# Patient Record
Sex: Male | Born: 1945
Health system: Southern US, Community
[De-identification: ages and names within clinical notes are randomized; demographics above are authoritative.]

## PROBLEM LIST (undated history)

## (undated) DIAGNOSIS — K219 Gastro-esophageal reflux disease without esophagitis: Secondary | ICD-10-CM

## (undated) DIAGNOSIS — E785 Hyperlipidemia, unspecified: Secondary | ICD-10-CM

## (undated) DIAGNOSIS — Z972 Presence of dental prosthetic device (complete) (partial): Secondary | ICD-10-CM

## (undated) HISTORY — PX: SHOULDER SURGERY: SHX246

## (undated) HISTORY — PX: PILONIDAL CYST EXCISION: SHX744

## (undated) HISTORY — DX: Gastro-esophageal reflux disease without esophagitis: K21.9

## (undated) HISTORY — DX: Hyperlipidemia, unspecified: E78.5

---

## 1967-09-15 HISTORY — PX: GANGLION CYST EXCISION: SHX1691

## 1986-09-14 HISTORY — PX: HEMORROIDECTOMY: SUR656

## 2004-09-14 HISTORY — PX: COLONOSCOPY: SHX174

## 2004-12-05 ENCOUNTER — Ambulatory Visit: Payer: Self-pay | Admitting: Gastroenterology

## 2006-12-09 ENCOUNTER — Ambulatory Visit: Payer: Self-pay | Admitting: Family Medicine

## 2007-08-15 ENCOUNTER — Ambulatory Visit: Payer: Self-pay | Admitting: Pain Medicine

## 2007-08-24 ENCOUNTER — Ambulatory Visit: Payer: Self-pay | Admitting: Pain Medicine

## 2007-09-29 ENCOUNTER — Ambulatory Visit: Payer: Self-pay | Admitting: Pain Medicine

## 2007-10-12 ENCOUNTER — Ambulatory Visit: Payer: Self-pay | Admitting: Pain Medicine

## 2007-11-22 ENCOUNTER — Ambulatory Visit: Payer: Self-pay | Admitting: Pain Medicine

## 2007-11-30 ENCOUNTER — Ambulatory Visit: Payer: Self-pay | Admitting: Pain Medicine

## 2008-01-17 ENCOUNTER — Ambulatory Visit: Payer: Self-pay | Admitting: Pain Medicine

## 2008-01-23 ENCOUNTER — Ambulatory Visit: Payer: Self-pay | Admitting: Pain Medicine

## 2011-08-30 ENCOUNTER — Ambulatory Visit: Payer: Self-pay | Admitting: Internal Medicine

## 2015-08-13 ENCOUNTER — Encounter: Payer: Self-pay | Admitting: Family Medicine

## 2015-08-13 ENCOUNTER — Ambulatory Visit (INDEPENDENT_AMBULATORY_CARE_PROVIDER_SITE_OTHER): Payer: PPO | Admitting: Family Medicine

## 2015-08-13 VITALS — BP 120/72 | HR 64 | Ht 71.0 in | Wt 194.2 lb

## 2015-08-13 DIAGNOSIS — J0100 Acute maxillary sinusitis, unspecified: Secondary | ICD-10-CM

## 2015-08-13 DIAGNOSIS — E785 Hyperlipidemia, unspecified: Secondary | ICD-10-CM | POA: Diagnosis not present

## 2015-08-13 DIAGNOSIS — K219 Gastro-esophageal reflux disease without esophagitis: Secondary | ICD-10-CM | POA: Diagnosis not present

## 2015-08-13 MED ORDER — AMOXICILLIN 500 MG PO CAPS: 500.0000 mg | ORAL_CAPSULE | Freq: Three times a day (TID) | ORAL | Status: DC

## 2015-08-13 MED ORDER — PANTOPRAZOLE SODIUM 40 MG PO TBEC: 40.0000 mg | DELAYED_RELEASE_TABLET | Freq: Every day | ORAL | Status: DC

## 2015-08-13 MED ORDER — SIMVASTATIN 20 MG PO TABS: 20.0000 mg | ORAL_TABLET | Freq: Every day | ORAL | Status: DC

## 2015-08-13 NOTE — Progress Notes (Signed)
Name: Miguel Adams   MRN: [id number]    DOB: 26-Jun-1946   Date:08/13/2015       Progress Note  Subjective  Chief Complaint  Chief Complaint  Patient presents with  . Hyperlipidemia  . Gastroesophageal Reflux    Hyperlipidemia This is a chronic problem. The current episode started more than 1 year ago. The problem is controlled. Recent lipid tests were reviewed and are normal. He has no history of diabetes, hypothyroidism, liver disease, obesity or nephrotic syndrome. There are no known factors aggravating his hyperlipidemia. Pertinent negatives include no focal sensory loss, focal weakness, leg pain, myalgias or shortness of breath. He is currently on no antihyperlipidemic treatment. The current treatment provides no improvement of lipids. There are no compliance problems.  Risk factors for coronary artery disease include dyslipidemia.  Gastroesophageal Reflux He complains of coughing. He reports no abdominal pain, no dysphagia, no heartburn, no nausea, no sore throat or no wheezing. This is a chronic problem. The current episode started more than 1 year ago. The problem occurs occasionally. The problem has been gradually improving. The symptoms are aggravated by certain foods. Pertinent negatives include no anemia, fatigue, melena, muscle weakness, orthopnea or weight loss. There are no known risk factors. He has tried nothing for the symptoms. The treatment provided no relief. Past procedures do not include an abdominal ultrasound, an EGD, esophageal manometry, esophageal pH monitoring, H. pylori antibody titer or a UGI.  Cough This is a new problem. The current episode started in the past 7 days. The problem has been waxing and waning. The problem occurs every few hours. The cough is non-productive. Associated symptoms include rhinorrhea. Pertinent negatives include no chills, ear congestion, ear pain, fever, headaches, heartburn, myalgias, rash, sore throat, shortness of breath, weight loss  or wheezing. Nothing aggravates the symptoms. He has tried OTC cough suppressant for the symptoms. The treatment provided no relief. There is no history of asthma, bronchiectasis, bronchitis, COPD, emphysema, environmental allergies or pneumonia.    No problem-specific assessment & plan notes found for this encounter.   No past medical history on file.  No past surgical history on file.  No family history on file.  Social History   Social History  . Marital Status: Married    Spouse Name: N/A  . Number of Children: N/A  . Years of Education: N/A   Occupational History  . Not on file.   Social History Main Topics  . Smoking status: Never Smoker   . Smokeless tobacco: Not on file  . Alcohol Use: 0.0 oz/week    0 Standard drinks or equivalent per week  . Drug Use: No  . Sexual Activity: Not on file   Other Topics Concern  . Not on file   Social History Narrative  . No narrative on file    No Known Allergies   Review of Systems  Constitutional: Negative for fever, chills, weight loss, diaphoresis and fatigue.  HENT: Positive for rhinorrhea. Negative for congestion, ear discharge, ear pain, hearing loss, nosebleeds, sore throat and tinnitus.   Respiratory: Positive for cough. Negative for sputum production, shortness of breath, wheezing and stridor.   Cardiovascular: Negative for leg swelling.  Gastrointestinal: Negative for heartburn, dysphagia, nausea, abdominal pain, diarrhea, constipation, blood in stool and melena.  Genitourinary: Negative for dysuria, urgency, frequency and hematuria.  Musculoskeletal: Negative for myalgias, back pain, joint pain and muscle weakness.  Skin: Negative for itching and rash.  Neurological: Negative for dizziness, tingling, sensory change, focal weakness,  weakness and headaches.  Endo/Heme/Allergies: Negative for environmental allergies and polydipsia. Does not bruise/bleed easily.  Psychiatric/Behavioral: Negative for depression and  suicidal ideas. The patient is not nervous/anxious and does not have insomnia.      Objective  Filed Vitals:   08/13/15 0827  BP: 120/72  Pulse: 64  Height: 5\' 11"  (1.803 m)  Weight: 194 lb 3.2 oz (88.089 kg)    Physical Exam  Constitutional: He is oriented to person, place, and time and well-developed, well-nourished, and in no distress.  HENT:  Head: Normocephalic.  Right Ear: External ear normal.  Left Ear: External ear normal.  Nose: Nose normal.  Mouth/Throat: Oropharynx is clear and moist.  Eyes: Conjunctivae and EOM are normal. Pupils are equal, round, and reactive to light. Right eye exhibits no discharge. Left eye exhibits no discharge. No scleral icterus.  Neck: Normal range of motion. Neck supple. No JVD present. No tracheal deviation present. No thyromegaly present.  Cardiovascular: Normal rate, regular rhythm, normal heart sounds and intact distal pulses.  Exam reveals no gallop and no friction rub.   No murmur heard. Pulmonary/Chest: Breath sounds normal. No respiratory distress. He has no wheezes. He has no rales.  Abdominal: Soft. Bowel sounds are normal. He exhibits no mass. There is no hepatosplenomegaly. There is no tenderness. There is no rebound, no guarding and no CVA tenderness.  Musculoskeletal: Normal range of motion. He exhibits no edema or tenderness.  Lymphadenopathy:    He has no cervical adenopathy.  Neurological: He is alert and oriented to person, place, and time. He has normal sensation, normal strength, normal reflexes and intact cranial nerves. No cranial nerve deficit.  Skin: Skin is warm. No rash noted.  Psychiatric: Mood and affect normal.  Nursing note and vitals reviewed.     Assessment & Plan  Problem List Items Addressed This Visit    None    Visit Diagnoses    Gastroesophageal reflux disease, esophagitis presence not specified    -  Primary    Relevant Medications    pantoprazole (PROTONIX) 40 MG tablet    Hyperlipidemia         Relevant Medications    simvastatin (ZOCOR) 20 MG tablet    Other Relevant Orders    Lipid Profile    Acute maxillary sinusitis, recurrence not specified        Relevant Medications    amoxicillin (AMOXIL) 500 MG capsule         Dr. Waunita Sandstrom [facility]  08/13/2015

## 2015-08-13 NOTE — Addendum Note (Signed)
Addended by: Fredderick Severance on: 08/13/2015 03:17 PM   Modules accepted: Orders

## 2015-08-14 LAB — LIPID PANEL
Chol/HDL Ratio: 4.8 ratio (ref 0.0–5.0)
Cholesterol, Total: 144 mg/dL (ref 100–199)
HDL: 30 mg/dL — ABNORMAL LOW
LDL Calculated: 91 mg/dL (ref 0–99)
Triglycerides: 114 mg/dL (ref 0–149)
VLDL Cholesterol Cal: 23 mg/dL (ref 5–40)

## 2015-12-02 ENCOUNTER — Ambulatory Visit (INDEPENDENT_AMBULATORY_CARE_PROVIDER_SITE_OTHER): Payer: PPO | Admitting: Family Medicine

## 2015-12-02 ENCOUNTER — Encounter: Payer: Self-pay | Admitting: Family Medicine

## 2015-12-02 VITALS — BP 140/80 | HR 80 | Ht 71.0 in | Wt 197.0 lb

## 2015-12-02 DIAGNOSIS — J0120 Acute ethmoidal sinusitis, unspecified: Secondary | ICD-10-CM | POA: Diagnosis not present

## 2015-12-02 MED ORDER — AMOXICILLIN-POT CLAVULANATE 875-125 MG PO TABS: 1.0000 | ORAL_TABLET | Freq: Two times a day (BID) | ORAL | Status: DC

## 2015-12-02 NOTE — Progress Notes (Signed)
Name: Miguel Adams   MRN: [id number]    DOB: 1946/04/09   Date:12/02/2015       Progress Note  Subjective  Chief Complaint  Chief Complaint  Patient presents with  . Sinusitis    cough and cong- no production, nasal stuffiness    Sinusitis This is a new problem. The current episode started in the past 7 days. The problem has been gradually worsening since onset. There has been no fever. The pain is mild. Associated symptoms include congestion, coughing, ear pain, headaches, a hoarse voice, sinus pressure, sneezing and a sore throat. Pertinent negatives include no chills, diaphoresis, neck pain or shortness of breath. Past treatments include acetaminophen and oral decongestants. The treatment provided no relief.    No problem-specific assessment & plan notes found for this encounter.   Past Medical History  Diagnosis Date  . GERD (gastroesophageal reflux disease)   . Hyperlipidemia     History reviewed. No pertinent past surgical history.  Family History  Problem Relation Age of Onset  . Diabetes Mother   . Hypertension Mother   . Heart disease Maternal Grandfather   . Stroke Maternal Grandfather     Social History   Social History  . Marital Status: Married    Spouse Name: N/A  . Number of Children: N/A  . Years of Education: N/A   Occupational History  . Not on file.   Social History Main Topics  . Smoking status: Never Smoker   . Smokeless tobacco: Not on file  . Alcohol Use: 0.0 oz/week    0 Standard drinks or equivalent per week  . Drug Use: No  . Sexual Activity: Yes   Other Topics Concern  . Not on file   Social History Narrative    No Known Allergies   Review of Systems  Constitutional: Negative for fever, chills, weight loss, malaise/fatigue and diaphoresis.  HENT: Positive for congestion, ear pain, hoarse voice, sinus pressure, sneezing and sore throat. Negative for ear discharge.   Eyes: Negative for blurred vision.  Respiratory:  Positive for cough. Negative for sputum production, shortness of breath and wheezing.   Cardiovascular: Negative for chest pain, palpitations and leg swelling.  Gastrointestinal: Negative for heartburn, nausea, abdominal pain, diarrhea, constipation, blood in stool and melena.  Genitourinary: Negative for dysuria, urgency, frequency and hematuria.  Musculoskeletal: Negative for myalgias, back pain, joint pain and neck pain.  Skin: Negative for rash.  Neurological: Positive for headaches. Negative for dizziness, tingling, sensory change and focal weakness.  Endo/Heme/Allergies: Negative for environmental allergies and polydipsia. Does not bruise/bleed easily.  Psychiatric/Behavioral: Negative for depression and suicidal ideas. The patient is not nervous/anxious and does not have insomnia.      Objective  Filed Vitals:   12/02/15 0831  BP: 140/80  Pulse: 80  Height: 5\' 11"  (1.803 m)  Weight: 197 lb (89.359 kg)    Physical Exam  Constitutional: He is oriented to person, place, and time and well-developed, well-nourished, and in no distress.  HENT:  Head: Normocephalic.  Right Ear: External ear normal.  Left Ear: External ear normal.  Nose: Nose normal.  Mouth/Throat: Oropharynx is clear and moist.  Eyes: Conjunctivae and EOM are normal. Pupils are equal, round, and reactive to light. Right eye exhibits no discharge. Left eye exhibits no discharge. No scleral icterus.  Neck: Normal range of motion. Neck supple. No JVD present. No tracheal deviation present. No thyromegaly present.  Cardiovascular: Normal rate, regular rhythm, normal heart sounds and intact distal  pulses.  Exam reveals no gallop and no friction rub.   No murmur heard. Pulmonary/Chest: Breath sounds normal. No respiratory distress. He has no wheezes. He has no rales.  Abdominal: Soft. Bowel sounds are normal. He exhibits no mass. There is no hepatosplenomegaly. There is no tenderness. There is no rebound, no guarding and  no CVA tenderness.  Musculoskeletal: Normal range of motion. He exhibits no edema or tenderness.  Lymphadenopathy:    He has no cervical adenopathy.  Neurological: He is alert and oriented to person, place, and time. He has normal sensation, normal strength, normal reflexes and intact cranial nerves. No cranial nerve deficit.  Skin: Skin is warm. No rash noted.  Psychiatric: Mood and affect normal.  Nursing note and vitals reviewed.     Assessment & Plan  Problem List Items Addressed This Visit    None    Visit Diagnoses    Acute ethmoidal sinusitis, recurrence not specified    -  Primary    Relevant Medications    amoxicillin-clavulanate (AUGMENTIN) 875-125 MG tablet         Dr. [facility]  12/02/2015

## 2016-02-12 ENCOUNTER — Encounter: Payer: Self-pay | Admitting: Family Medicine

## 2016-02-12 ENCOUNTER — Ambulatory Visit (INDEPENDENT_AMBULATORY_CARE_PROVIDER_SITE_OTHER): Payer: PPO | Admitting: Family Medicine

## 2016-02-12 VITALS — BP 120/70 | HR 60 | Ht 71.0 in | Wt 194.0 lb

## 2016-02-12 DIAGNOSIS — K219 Gastro-esophageal reflux disease without esophagitis: Secondary | ICD-10-CM | POA: Diagnosis not present

## 2016-02-12 DIAGNOSIS — E785 Hyperlipidemia, unspecified: Secondary | ICD-10-CM | POA: Diagnosis not present

## 2016-02-12 MED ORDER — SIMVASTATIN 20 MG PO TABS: 20.0000 mg | ORAL_TABLET | Freq: Every day | ORAL | Status: DC

## 2016-02-12 MED ORDER — PANTOPRAZOLE SODIUM 40 MG PO TBEC: 40.0000 mg | DELAYED_RELEASE_TABLET | Freq: Every day | ORAL | Status: DC

## 2016-02-12 NOTE — Progress Notes (Signed)
Patient: Miguel Adams, Male    DOB: November 27, 1945, 70 y.o.   MRN: [id number] Visit Date: 02/12/2016  Today's Provider: Otilio Miu, MD   Chief Complaint  Patient presents with  . medicare annual wellness  . Gastroesophageal Reflux  . Hyperlipidemia   Subjective:   Initial preventative physical exam Miguel Adams is a 70 y.o. male who presents today for his Initial Preventative Physical Exam. He feels well. He reports exercising at [facility] [employer] He reports he is sleeping well.  Gastroesophageal Reflux He reports no abdominal pain, no belching, no chest pain, no choking, no coughing, no dysphagia, no early satiety, no globus sensation, no heartburn, no hoarse voice, no nausea, no sore throat, no stridor, no tooth decay, no water brash or no wheezing. This is a chronic problem. The current episode started 1 to 4 weeks ago. The problem occurs frequently. The problem has been gradually improving. The symptoms are aggravated by certain foods. Pertinent negatives include no fatigue. He has tried a PPI for the symptoms. The treatment provided moderate relief.  Hyperlipidemia This is a chronic problem. The current episode started more than 1 year ago. The problem is controlled. Recent lipid tests were reviewed and are normal. He has no history of chronic renal disease, diabetes, hypothyroidism, liver disease, obesity or nephrotic syndrome. Factors aggravating his hyperlipidemia include thiazides. Pertinent negatives include no chest pain, focal sensory loss, focal weakness, leg pain, myalgias or shortness of breath. Current antihyperlipidemic treatment includes statins. The current treatment provides moderate improvement of lipids. There are no compliance problems.  Risk factors for coronary artery disease include diabetes mellitus and dyslipidemia.    Review of Systems  Constitutional: Negative for fever, chills, appetite change, fatigue and unexpected weight change.  HENT: Negative for ear  pain, facial swelling, hearing loss, hoarse voice, nosebleeds, sneezing, sore throat and trouble swallowing.   Eyes: Negative for photophobia, pain, discharge, redness, itching and visual disturbance.  Respiratory: Negative for cough, choking, chest tightness, shortness of breath and wheezing.   Cardiovascular: Negative for chest pain, palpitations and leg swelling.  Gastrointestinal: Negative for heartburn, dysphagia, nausea, vomiting, abdominal pain, diarrhea, constipation, blood in stool and rectal pain.  Endocrine: Negative for cold intolerance, heat intolerance, polydipsia, polyphagia and polyuria.  Genitourinary: Negative for dysuria, urgency, frequency, hematuria, flank pain, decreased urine volume, discharge, penile swelling, scrotal swelling, difficulty urinating, penile pain and testicular pain.  Musculoskeletal: Negative for myalgias, back pain, joint swelling, neck pain and neck stiffness.  Skin: Negative for color change and rash.  Allergic/Immunologic: Negative for immunocompromised state.  Neurological: Negative for dizziness, tremors, focal weakness, seizures, syncope, speech difficulty, weakness, light-headedness, numbness and headaches.  Hematological: Does not bruise/bleed easily.  Psychiatric/Behavioral: Negative for suicidal ideas, hallucinations, behavioral problems, confusion, self-injury, dysphoric mood and agitation. The patient is not nervous/anxious.     Social History   Social History  . Marital Status: Married    Spouse Name: N/A  . Number of Children: N/A  . Years of Education: N/A   Occupational History  . Not on file.   Social History Main Topics  . Smoking status: Never Smoker   . Smokeless tobacco: Not on file  . Alcohol Use: 0.0 oz/week    0 Standard drinks or equivalent per week  . Drug Use: No  . Sexual Activity: Yes   Other Topics Concern  . Not on file   Social History Narrative    There are no active problems to display for this  patient.  History reviewed. No pertinent past surgical history.  His family history includes Diabetes in his mother; Heart disease in his maternal grandfather; Hypertension in his mother; Stroke in his maternal grandfather.    Previous Medications   ASPIRIN EC 81 MG TABLET    Take 1 tablet by mouth daily.   TIMOLOL (TIMOPTIC) 0.5 % OPHTHALMIC SOLUTION    1 drop 2 (two) times daily.    Patient Care Team: Juline Patch, MD as PCP - General (Family Medicine)     Objective:   Vitals: BP 120/70 mmHg  Pulse 60  Ht 5\' 11"  (1.803 m)  Wt 194 lb (87.998 kg)  BMI 27.07 kg/m2  Physical Exam  Constitutional: He is oriented to person, place, and time. He appears well-developed and well-nourished.  HENT:  Head: Normocephalic.  Right Ear: External ear normal.  Left Ear: External ear normal.  Nose: Nose normal.  Mouth/Throat: Oropharynx is clear and moist.  Eyes: Conjunctivae and EOM are normal. Pupils are equal, round, and reactive to light.  Neck: Normal range of motion. Neck supple.  Cardiovascular: Normal rate, regular rhythm, normal heart sounds and intact distal pulses.   Pulmonary/Chest: Effort normal and breath sounds normal.  Abdominal: Soft. Bowel sounds are normal.  Genitourinary: Rectum normal, prostate normal and penis normal.  Musculoskeletal: Normal range of motion.  Neurological: He is alert and oriented to person, place, and time. He has normal reflexes.  Skin: Skin is warm and dry.  Psychiatric: He has a normal mood and affect. His behavior is normal. Judgment and thought content normal.  Nursing note and vitals reviewed.    No exam data present  Activities of Daily Living In your present state of health, do you have any difficulty performing the following activities: 12/02/2015  Hearing? N  Vision? N  Difficulty concentrating or making decisions? N  Walking or climbing stairs? N  Dressing or bathing? N  Doing errands, shopping? N    Fall Risk  Assessment Fall Risk  12/02/2015  Falls in the past year? No     Patient reports there are not safety devices in place in shower at home.   Depression Screen PHQ 2/9 Scores 12/02/2015  PHQ - 2 Score 0    Cognitive Testing - 6-CIT   Correct? Score   What year is it? yes 0 Yes = 0    No = 4  What month is it? yes 0 Yes = 0    No = 3  Remember:     [address]ia [address], [place], [place]     What time is it? yes 0 Yes = 0    No = 3  Count backwards from 20 to 1 yes 0 Correct = 0    1 error = 2   More than 1 error = 4  Say the months of the year in reverse. yes 0 Correct = 0    1 error = 2   More than 1 error = 4  What address did I ask you to remember? yes 0 Correct = 0  1 error = 2    2 error = 4    3 error = 6    4 error = 8    All wrong = 10       TOTAL SCORE  0/28   Interpretation:  Normal  Normal (0-7) Abnormal (8-28)     Assessment & Plan:     Initial Preventative Physical Exam  Reviewed patient's Family Medical  History Reviewed and updated list of patient's medical providers Assessment of cognitive impairment was done Assessed patient's functional ability Established a written schedule for health screening [place] Completed and Reviewed  Exercise Activities and Dietary recommendations Goals    None      Immunization History  Administered Date(s) Administered  . Influenza-Unspecified 06/28/2015    Health Maintenance  Topic Date Due  . Hepatitis C Screening  02-25-1946  . TETANUS/TDAP  05/30/1967  . COLONOSCOPY  05/29/1998  . ZOSTAVAX  05/29/2008  . PNA vac Low Risk Adult (1 of 2 - PCV13) 05/29/2013  . INFLUENZA VACCINE  04/14/2016      Discussed health benefits of physical activity, and encouraged him to engage in regular exercise appropriate for his age and condition.    ------------------------------------------------------------------------------------------------------------   Problem List Items Addressed This Visit    None     Visit Diagnoses    Hyperlipidemia    -  Primary    Relevant Medications    aspirin EC 81 MG tablet    simvastatin (ZOCOR) 20 MG tablet    Other Relevant Orders    Lipid Profile    Gastroesophageal reflux disease, esophagitis presence not specified        Relevant Medications    pantoprazole (PROTONIX) 40 MG tablet        Otilio Miu, MD [facility]  02/12/2016

## 2016-02-13 LAB — LIPID PANEL
Chol/HDL Ratio: 3.8 ratio (ref 0.0–5.0)
Cholesterol, Total: 133 mg/dL (ref 100–199)
HDL: 35 mg/dL — ABNORMAL LOW
LDL Calculated: 71 mg/dL (ref 0–99)
Triglycerides: 134 mg/dL (ref 0–149)
VLDL Cholesterol Cal: 27 mg/dL (ref 5–40)

## 2016-02-13 MED ORDER — SIMVASTATIN 20 MG PO TABS: 20.0000 mg | ORAL_TABLET | Freq: Every day | ORAL | Status: DC

## 2016-02-13 MED ORDER — PANTOPRAZOLE SODIUM 40 MG PO TBEC: 40.0000 mg | DELAYED_RELEASE_TABLET | Freq: Every day | ORAL | Status: DC

## 2016-02-13 NOTE — Addendum Note (Signed)
Addended by: Fredderick Severance on: 02/13/2016 11:29 AM   Modules accepted: Orders

## 2016-06-15 ENCOUNTER — Other Ambulatory Visit: Payer: Self-pay | Admitting: Family Medicine

## 2016-06-15 DIAGNOSIS — E785 Hyperlipidemia, unspecified: Secondary | ICD-10-CM

## 2016-06-15 DIAGNOSIS — K219 Gastro-esophageal reflux disease without esophagitis: Secondary | ICD-10-CM

## 2016-10-05 DIAGNOSIS — R69 Illness, unspecified: Secondary | ICD-10-CM | POA: Diagnosis not present

## 2016-10-05 DIAGNOSIS — H401131 Primary open-angle glaucoma, bilateral, mild stage: Secondary | ICD-10-CM | POA: Diagnosis not present

## 2016-10-20 ENCOUNTER — Ambulatory Visit (INDEPENDENT_AMBULATORY_CARE_PROVIDER_SITE_OTHER): Payer: Medicare HMO | Admitting: Family Medicine

## 2016-10-20 ENCOUNTER — Encounter: Payer: Self-pay | Admitting: Family Medicine

## 2016-10-20 VITALS — BP 140/100 | HR 100 | Temp 98.8°F | Ht 71.0 in | Wt 202.0 lb

## 2016-10-20 DIAGNOSIS — K219 Gastro-esophageal reflux disease without esophagitis: Secondary | ICD-10-CM

## 2016-10-20 DIAGNOSIS — E782 Mixed hyperlipidemia: Secondary | ICD-10-CM | POA: Diagnosis not present

## 2016-10-20 DIAGNOSIS — J0100 Acute maxillary sinusitis, unspecified: Secondary | ICD-10-CM

## 2016-10-20 MED ORDER — AMOXICILLIN 500 MG PO CAPS: 500.0000 mg | ORAL_CAPSULE | Freq: Three times a day (TID) | ORAL | 1 refills | Status: DC

## 2016-10-20 MED ORDER — SIMVASTATIN 40 MG PO TABS: 40.0000 mg | ORAL_TABLET | Freq: Every day | ORAL | 3 refills | Status: DC

## 2016-10-20 MED ORDER — PANTOPRAZOLE SODIUM 40 MG PO TBEC: 40.0000 mg | DELAYED_RELEASE_TABLET | Freq: Every day | ORAL | 0 refills | Status: DC

## 2016-10-20 NOTE — Progress Notes (Signed)
Name: Miguel Adams   MRN: [id number]    DOB: 1946-04-10   Date:10/20/2016       Progress Note  Subjective  Chief Complaint  Chief Complaint  Patient presents with  . Gastroesophageal Reflux  . Hyperlipidemia  . Sinusitis    cough and cong, scratchy throat, nasal drainage    Gastroesophageal Reflux  He complains of coughing and a hoarse voice. He reports no abdominal pain, no belching, no chest pain, no choking, no dysphagia, no early satiety, no globus sensation, no heartburn, no nausea, no sore throat, no stridor, no tooth decay, no water brash or no wheezing. This is a chronic problem. The current episode started in the past 7 days. The problem occurs constantly. The problem has been waxing and waning. The symptoms are aggravated by certain foods. Pertinent negatives include no anemia, fatigue, melena, muscle weakness, orthopnea or weight loss. There are no known risk factors. He has tried a PPI for the symptoms. The treatment provided moderate relief. Past procedures do not include esophageal pH monitoring.  Hyperlipidemia  The current episode started more than 1 year ago. The problem is controlled. Recent lipid tests were reviewed and are normal. There are no known factors aggravating his hyperlipidemia. Pertinent negatives include no chest pain, focal sensory loss, focal weakness, leg pain, myalgias or shortness of breath. Current antihyperlipidemic treatment includes statins. The current treatment provides moderate improvement of lipids. There are no compliance problems.  Risk factors for coronary artery disease include hypertension.  Sinusitis  This is a chronic problem. The current episode started in the past 7 days. The problem has been gradually improving since onset. There has been no fever. Associated symptoms include coughing and a hoarse voice. Pertinent negatives include no chills, congestion, diaphoresis, ear pain, headaches, neck pain, shortness of breath, sinus pressure,  sneezing, sore throat or swollen glands. The treatment provided moderate relief.    No problem-specific Assessment & Plan notes found for this encounter.   Past Medical History:  Diagnosis Date  . GERD (gastroesophageal reflux disease)   . Hyperlipidemia     History reviewed. No pertinent surgical history.  Family History  Problem Relation Age of Onset  . Diabetes Mother   . Hypertension Mother   . Heart disease Maternal Grandfather   . Stroke Maternal Grandfather     Social History   Social History  . Marital status: Married    Spouse name: N/A  . Number of children: N/A  . Years of education: N/A   Occupational History  . Not on file.   Social History Main Topics  . Smoking status: Never Smoker  . Smokeless tobacco: Not on file  . Alcohol use 0.0 oz/week  . Drug use: No  . Sexual activity: Yes   Other Topics Concern  . Not on file   Social History Narrative  . No narrative on file    No Known Allergies   Review of Systems  Constitutional: Negative for chills, diaphoresis, fatigue and weight loss.  HENT: Positive for hoarse voice. Negative for congestion, ear pain, sinus pressure, sneezing and sore throat.   Respiratory: Positive for cough. Negative for choking, shortness of breath and wheezing.   Cardiovascular: Negative for chest pain.  Gastrointestinal: Negative for abdominal pain, dysphagia, heartburn, melena and nausea.  Musculoskeletal: Negative for myalgias, muscle weakness and neck pain.  Neurological: Negative for focal weakness and headaches.     Objective  Vitals:   10/20/16 1002  BP: (!) 140/100  Pulse: 100  Temp: 98.8 F (37.1 C)  TempSrc: Oral  Weight: 202 lb (91.6 kg)  Height: 5\' 11"  (1.803 m)    Physical Exam  Constitutional: He is oriented to person, place, and time and well-developed, well-nourished, and in no distress.  HENT:  Head: Normocephalic.  Right Ear: External ear normal.  Left Ear: External ear normal.  Nose:  Nose normal.  Mouth/Throat: Oropharynx is clear and moist.  Eyes: Conjunctivae and EOM are normal. Pupils are equal, round, and reactive to light. Right eye exhibits no discharge. Left eye exhibits no discharge. No scleral icterus.  Neck: Normal range of motion. Neck supple. No JVD present. No tracheal deviation present. No thyromegaly present.  Cardiovascular: Normal rate, regular rhythm, normal heart sounds and intact distal pulses.  Exam reveals no gallop and no friction rub.   No murmur heard. Pulmonary/Chest: Breath sounds normal. No respiratory distress. He has no wheezes. He has no rales.  Abdominal: Soft. Bowel sounds are normal. He exhibits no mass. There is no hepatosplenomegaly. There is no tenderness. There is no rebound, no guarding and no CVA tenderness.  Musculoskeletal: Normal range of motion. He exhibits no edema or tenderness.  Lymphadenopathy:    He has no cervical adenopathy.  Neurological: He is alert and oriented to person, place, and time. He has normal sensation, normal strength, normal reflexes and intact cranial nerves. No cranial nerve deficit.  Skin: Skin is warm. No rash noted.  Psychiatric: Mood and affect normal.  Nursing note and vitals reviewed.     Assessment & Plan  Problem List Items Addressed This Visit    None    Visit Diagnoses    Mixed hyperlipidemia    -  Primary   Relevant Medications   simvastatin (ZOCOR) 40 MG tablet   Other Relevant Orders   Lipid Profile   Gastroesophageal reflux disease, esophagitis presence not specified       Relevant Medications   pantoprazole (PROTONIX) 40 MG tablet   Acute maxillary sinusitis, recurrence not specified       Relevant Medications   amoxicillin (AMOXIL) 500 MG capsule        Dr. Kyriaki Moder [facility]  10/20/16

## 2016-10-21 ENCOUNTER — Ambulatory Visit: Payer: PPO | Admitting: Family Medicine

## 2016-10-21 LAB — LIPID PANEL
Chol/HDL Ratio: 3.9 ratio (ref 0.0–5.0)
Cholesterol, Total: 143 mg/dL (ref 100–199)
HDL: 37 mg/dL — ABNORMAL LOW
LDL Calculated: 81 mg/dL (ref 0–99)
Triglycerides: 124 mg/dL (ref 0–149)
VLDL Cholesterol Cal: 25 mg/dL (ref 5–40)

## 2016-10-22 ENCOUNTER — Other Ambulatory Visit: Payer: Self-pay

## 2016-10-22 MED ORDER — BENZONATATE 100 MG PO CAPS: 100.0000 mg | ORAL_CAPSULE | Freq: Two times a day (BID) | ORAL | 0 refills | Status: DC | PRN

## 2016-10-23 ENCOUNTER — Ambulatory Visit (INDEPENDENT_AMBULATORY_CARE_PROVIDER_SITE_OTHER): Payer: Medicare HMO | Admitting: Family Medicine

## 2016-10-23 VITALS — BP 130/64 | HR 90 | Temp 98.4°F | Ht 71.0 in | Wt 202.0 lb

## 2016-10-23 DIAGNOSIS — J0100 Acute maxillary sinusitis, unspecified: Secondary | ICD-10-CM

## 2016-10-23 DIAGNOSIS — J111 Influenza due to unidentified influenza virus with other respiratory manifestations: Secondary | ICD-10-CM | POA: Diagnosis not present

## 2016-10-23 LAB — POCT INFLUENZA A/B
Influenza A, POC: NEGATIVE
Influenza B, POC: NEGATIVE

## 2016-10-23 MED ORDER — OSELTAMIVIR PHOSPHATE 75 MG PO CAPS: 75.0000 mg | ORAL_CAPSULE | Freq: Two times a day (BID) | ORAL | 0 refills | Status: DC

## 2016-10-23 MED ORDER — AMOXICILLIN-POT CLAVULANATE 875-125 MG PO TABS: 1.0000 | ORAL_TABLET | Freq: Two times a day (BID) | ORAL | 0 refills | Status: DC

## 2016-10-23 NOTE — Progress Notes (Signed)
Name: Miguel Adams   MRN: [id number]    DOB: 06-15-1946   Date:10/23/2016       Progress Note  Subjective  Chief Complaint  Chief Complaint  Patient presents with  . Sinusitis    still having cong, cough, sweating, nose running- taking day 3 of Amoxil and called in [place] yesterday for cough- helped some but ribs are hurting from coughing.    Sinusitis  This is a new problem. The current episode started in the past 7 days. The problem has been gradually improving since onset. Associated symptoms include chills, congestion, coughing, diaphoresis, headaches and sinus pressure. Pertinent negatives include no ear pain, hoarse voice, neck pain, shortness of breath, sneezing, sore throat or swollen glands. Past treatments include antibiotics. The treatment provided mild relief.    No problem-specific Assessment & Plan notes found for this encounter.   Past Medical History:  Diagnosis Date  . GERD (gastroesophageal reflux disease)   . Hyperlipidemia     No past surgical history on file.  Family History  Problem Relation Age of Onset  . Diabetes Mother   . Hypertension Mother   . Heart disease Maternal Grandfather   . Stroke Maternal Grandfather     Social History   Social History  . Marital status: Married    Spouse name: N/A  . Number of children: N/A  . Years of education: N/A   Occupational History  . Not on file.   Social History Main Topics  . Smoking status: Never Smoker  . Smokeless tobacco: Not on file  . Alcohol use 0.0 oz/week  . Drug use: No  . Sexual activity: Yes   Other Topics Concern  . Not on file   Social History Narrative  . No narrative on file    No Known Allergies   Review of Systems  Constitutional: Positive for chills and diaphoresis. Negative for fever, malaise/fatigue and weight loss.  HENT: Positive for congestion and sinus pressure. Negative for ear discharge, ear pain, hoarse voice, sneezing and sore throat.   Eyes:  Negative for blurred vision.  Respiratory: Positive for cough. Negative for sputum production, shortness of breath and wheezing.   Cardiovascular: Negative for chest pain, palpitations and leg swelling.  Gastrointestinal: Negative for abdominal pain, blood in stool, constipation, diarrhea, heartburn, melena and nausea.  Genitourinary: Negative for dysuria, frequency, hematuria and urgency.  Musculoskeletal: Negative for back pain, joint pain, myalgias and neck pain.  Skin: Negative for rash.  Neurological: Positive for headaches. Negative for dizziness, tingling, sensory change and focal weakness.  Endo/Heme/Allergies: Negative for environmental allergies and polydipsia. Does not bruise/bleed easily.  Psychiatric/Behavioral: Negative for depression and suicidal ideas. The patient is not nervous/anxious and does not have insomnia.      Objective  Vitals:   10/23/16 1027  BP: 130/64  Pulse: 90  Temp: 98.4 F (36.9 C)  TempSrc: Oral  SpO2: 96%  Weight: 202 lb (91.6 kg)  Height: 5\' 11"  (1.803 m)    Physical Exam  Constitutional: He is oriented to person, place, and time and well-developed, well-nourished, and in no distress.  HENT:  Head: Normocephalic.  Right Ear: External ear normal.  Left Ear: External ear normal.  Nose: Nose normal. Right sinus exhibits no maxillary sinus tenderness and no frontal sinus tenderness. Left sinus exhibits no maxillary sinus tenderness and no frontal sinus tenderness.  Mouth/Throat: Oropharynx is clear and moist.  Eyes: Conjunctivae and EOM are normal. Pupils are equal, round, and reactive to light.  Right eye exhibits no discharge. Left eye exhibits no discharge. No scleral icterus.  Neck: Normal range of motion. Neck supple. No JVD present. No tracheal deviation present. No thyromegaly present.  Cardiovascular: Normal rate, regular rhythm, normal heart sounds and intact distal pulses.  Exam reveals no gallop and no friction rub.   No murmur  heard. Pulmonary/Chest: Breath sounds normal. No respiratory distress. He has no wheezes. He has no rales.  Abdominal: Soft. Bowel sounds are normal. He exhibits no mass. There is no hepatosplenomegaly. There is no tenderness. There is no rebound, no guarding and no CVA tenderness.  Musculoskeletal: Normal range of motion. He exhibits no edema or tenderness.  Lymphadenopathy:    He has no cervical adenopathy.  Neurological: He is alert and oriented to person, place, and time. He has normal sensation, normal strength, normal reflexes and intact cranial nerves. No cranial nerve deficit.  Skin: Skin is warm. No rash noted.  Psychiatric: Mood and affect normal.  Nursing note and vitals reviewed.     Assessment & Plan  Problem List Items Addressed This Visit    None    Visit Diagnoses    Acute maxillary sinusitis, recurrence not specified    -  Primary   Relevant Medications   cetirizine (ZYRTEC) 10 MG tablet   amoxicillin-clavulanate (AUGMENTIN) 875-125 MG tablet   oseltamivir (TAMIFLU) 75 MG capsule   Influenza       Relevant Medications   oseltamivir (TAMIFLU) 75 MG capsule   Other Relevant Orders   POCT Influenza A/B (Completed)     Pt tested positive for Influenza B- late test resulting in positive   Dr. [facility]  10/23/16

## 2017-02-01 ENCOUNTER — Telehealth: Payer: Self-pay | Admitting: Family Medicine

## 2017-02-01 NOTE — Telephone Encounter (Signed)
Called pt to schedule Annual Wellness Visit with Nurse Health Advisor for 6/4:  - knb

## 2017-02-16 ENCOUNTER — Ambulatory Visit (INDEPENDENT_AMBULATORY_CARE_PROVIDER_SITE_OTHER): Payer: Medicare HMO | Admitting: Family Medicine

## 2017-02-16 ENCOUNTER — Encounter: Payer: Self-pay | Admitting: Family Medicine

## 2017-02-16 VITALS — BP 120/68 | HR 60 | Ht 71.0 in | Wt 198.0 lb

## 2017-02-16 DIAGNOSIS — Z125 Encounter for screening for malignant neoplasm of prostate: Secondary | ICD-10-CM | POA: Diagnosis not present

## 2017-02-16 DIAGNOSIS — Z1211 Encounter for screening for malignant neoplasm of colon: Secondary | ICD-10-CM

## 2017-02-16 DIAGNOSIS — Z0000 Encounter for general adult medical examination without abnormal findings: Secondary | ICD-10-CM

## 2017-02-16 DIAGNOSIS — R69 Illness, unspecified: Secondary | ICD-10-CM

## 2017-02-16 LAB — HEMOCCULT GUIAC POC 1CARD (OFFICE): Fecal Occult Blood, POC: NEGATIVE

## 2017-02-16 NOTE — Progress Notes (Signed)
Name: Miguel Adams   MRN: [id number]    DOB: 04/08/1946   Date:02/16/2017       Progress Note  Subjective  Chief Complaint  Chief Complaint  Patient presents with  . Annual Exam    liver, renal and psa drawn    Patient present for annual physical exam.    No problem-specific Assessment & Plan notes found for this encounter.   Past Medical History:  Diagnosis Date  . GERD (gastroesophageal reflux disease)   . Hyperlipidemia     No past surgical history on file.  Family History  Problem Relation Age of Onset  . Diabetes Mother   . Hypertension Mother   . Heart disease Maternal Grandfather   . Stroke Maternal Grandfather     Social History   Social History  . Marital status: Married    Spouse name: N/A  . Number of children: N/A  . Years of education: N/A   Occupational History  . Not on file.   Social History Main Topics  . Smoking status: Never Smoker  . Smokeless tobacco: Never Used  . Alcohol use 0.0 oz/week  . Drug use: No  . Sexual activity: Yes   Other Topics Concern  . Not on file   Social History Narrative  . No narrative on file    No Known Allergies  Outpatient Medications Prior to Visit  Medication Sig Dispense Refill  . aspirin EC 81 MG tablet Take 1 tablet by mouth daily.    . cetirizine (ZYRTEC) 10 MG tablet Take 10 mg by mouth daily.    . pantoprazole (PROTONIX) 40 MG tablet Take 1 tablet (40 mg total) by mouth daily. 90 tablet 0  . timolol (TIMOPTIC) 0.5 % ophthalmic solution 1 drop 2 (two) times daily. Eye Dr    . amoxicillin (AMOXIL) 500 MG capsule Take 1 capsule (500 mg total) by mouth 3 (three) times daily. 30 capsule 1  . amoxicillin-clavulanate (AUGMENTIN) 875-125 MG tablet Take 1 tablet by mouth 2 (two) times daily. 20 tablet 0  . benzonatate (TESSALON) 100 MG capsule Take 1 capsule (100 mg total) by mouth 2 (two) times daily as needed for cough. 20 capsule 0  . oseltamivir (TAMIFLU) 75 MG capsule Take 1 capsule (75 mg  total) by mouth 2 (two) times daily. 10 capsule 0  . simvastatin (ZOCOR) 40 MG tablet Take 1 tablet (40 mg total) by mouth at bedtime. One half tablet q day instead of 1 a day 45 tablet 3   No facility-administered medications prior to visit.     Review of Systems  Constitutional: Negative for chills, fever, malaise/fatigue and weight loss.  HENT: Negative for ear discharge, ear pain and sore throat.   Eyes: Negative for blurred vision.  Respiratory: Negative for cough, sputum production, shortness of breath and wheezing.   Cardiovascular: Negative for chest pain, palpitations and leg swelling.  Gastrointestinal: Negative for abdominal pain, blood in stool, constipation, diarrhea, heartburn, melena and nausea.  Genitourinary: Negative for dysuria, frequency, hematuria and urgency.  Musculoskeletal: Negative for back pain, joint pain, myalgias and neck pain.  Skin: Negative for rash.  Neurological: Negative for dizziness, tingling, sensory change, focal weakness and headaches.  Endo/Heme/Allergies: Negative for environmental allergies and polydipsia. Does not bruise/bleed easily.  Psychiatric/Behavioral: Negative for depression and suicidal ideas. The patient is not nervous/anxious and does not have insomnia.      Objective  Vitals:   02/16/17 0840  BP: 120/68  Pulse: 60  Weight: 198  lb (89.8 kg)  Height: 5\' 11"  (1.803 m)    Physical Exam  Constitutional: He is oriented to person, place, and time and well-developed, well-nourished, and in no distress.  HENT:  Head: Normocephalic.  Right Ear: Tympanic membrane, external ear and ear canal normal.  Left Ear: Tympanic membrane, external ear and ear canal normal.  Nose: Nose normal.  Mouth/Throat: Uvula is midline, oropharynx is clear and moist and mucous membranes are normal.  Eyes: Conjunctivae, EOM and lids are normal. Pupils are equal, round, and reactive to light. Right eye exhibits no discharge. Left eye exhibits no discharge.  No scleral icterus.  Fundoscopic exam:      The right eye shows no arteriolar narrowing, no AV nicking and no papilledema.       The left eye shows no arteriolar narrowing, no AV nicking and no papilledema.  Neck: Trachea normal and normal range of motion. Neck supple. Normal carotid pulses, no hepatojugular reflux and no JVD present. Carotid bruit is not present. No tracheal deviation present. No thyromegaly present.  Cardiovascular: Normal rate, regular rhythm, S1 normal, S2 normal, normal heart sounds, intact distal pulses and normal pulses.  Exam reveals no gallop, no S3, no S4 and no friction rub.   No murmur heard. Pulmonary/Chest: Effort normal and breath sounds normal. No respiratory distress. He has no wheezes. He has no rales. Right breast exhibits no inverted nipple, no mass, no nipple discharge, no skin change and no tenderness. Left breast exhibits no inverted nipple, no mass, no nipple discharge, no skin change and no tenderness. Breasts are symmetrical.  Abdominal: Soft. Normal aorta and bowel sounds are normal. He exhibits no mass. There is no hepatosplenomegaly, splenomegaly or hepatomegaly. There is no tenderness. There is no rebound, no guarding and no CVA tenderness.  Genitourinary: Rectum normal, prostate normal, testes/scrotum normal and penis normal.  Musculoskeletal: Normal range of motion. He exhibits no edema or tenderness.       Cervical back: Normal.       Thoracic back: Normal.       Lumbar back: Normal.  Lymphadenopathy:       Head (right side): No submental and no submandibular adenopathy present.       Head (left side): No submental and no submandibular adenopathy present.    He has no cervical adenopathy.       Right cervical: No superficial cervical adenopathy present.      Left cervical: No superficial cervical adenopathy present.    He has no axillary adenopathy.  Neurological: He is alert and oriented to person, place, and time. He has normal sensation,  normal strength, normal reflexes and intact cranial nerves. No cranial nerve deficit.  Skin: Skin is warm, dry and intact. No rash noted.  Psychiatric: Mood and affect normal.  Nursing note and vitals reviewed.     Assessment & Plan  Problem List Items Addressed This Visit    None    Visit Diagnoses    Annual physical exam    -  Primary   Relevant Orders   Renal Function Panel   PSA   Colon cancer screening       Relevant Orders   POCT occult blood stool (Completed)   Ambulatory referral to Gastroenterology   Taking medication for chronic disease       Relevant Orders   Hepatic Function Panel (6)   Prostate cancer screening       Relevant Orders   PSA      No  orders of the defined types were placed in this encounter.     Dr. [facility]  02/16/17

## 2017-02-17 LAB — PSA: Prostate Specific Ag, Serum: 0.4 ng/mL (ref 0.0–4.0)

## 2017-02-17 LAB — HEPATIC FUNCTION PANEL (6)
ALT: 19 [IU]/L (ref 0–44)
AST: 18 [IU]/L (ref 0–40)
Alkaline Phosphatase: 55 [IU]/L (ref 39–117)
Bilirubin Total: 0.4 mg/dL (ref 0.0–1.2)
Bilirubin, Direct: 0.11 mg/dL (ref 0.00–0.40)

## 2017-02-17 LAB — RENAL FUNCTION PANEL
Albumin: 4.6 g/dL (ref 3.6–4.8)
BUN/Creatinine Ratio: 14 (ref 10–24)
BUN: 14 mg/dL (ref 8–27)
CO2: 26 mmol/L (ref 18–29)
Calcium: 9.2 mg/dL (ref 8.6–10.2)
Chloride: 104 mmol/L (ref 96–106)
Creatinine, Ser: 0.98 mg/dL (ref 0.76–1.27)
GFR calc Af Amer: 91 mL/min/{1.73_m2}
GFR calc non Af Amer: 79 mL/min/{1.73_m2}
Glucose: 108 mg/dL — ABNORMAL HIGH (ref 65–99)
Phosphorus: 2.8 mg/dL (ref 2.5–4.5)
Potassium: 4.3 mmol/L (ref 3.5–5.2)
Sodium: 142 mmol/L (ref 134–144)

## 2017-03-02 ENCOUNTER — Other Ambulatory Visit: Payer: Self-pay

## 2017-03-02 ENCOUNTER — Telehealth: Payer: Self-pay

## 2017-03-02 DIAGNOSIS — Z1211 Encounter for screening for malignant neoplasm of colon: Secondary | ICD-10-CM

## 2017-03-02 MED ORDER — NA SULFATE-K SULFATE-MG SULF 17.5-3.13-1.6 GM/177ML PO SOLN: 1.0000 | Freq: Once | ORAL | 0 refills | Status: AC

## 2017-03-02 NOTE — Telephone Encounter (Signed)
Gastroenterology Pre-Procedure Review  Request Date: 04/01/17 Requesting Physician: Dr. Allen Norris  PATIENT REVIEW QUESTIONS: The patient responded to the following health history questions as indicated:    1. Are you having any GI issues? no 2. Do you have a personal history of Polyps? no 3. Do you have a family history of Colon Cancer or Polyps? no 4. Diabetes Mellitus? no 5. Joint replacements in the past 12 months?no 6. Major health problems in the past 3 months?no 7. Any artificial heart valves, MVP, or defibrillator?no    MEDICATIONS & ALLERGIES:    Patient reports the following regarding taking any anticoagulation/antiplatelet therapy:   Plavix, Coumadin, Eliquis, Xarelto, Lovenox, Pradaxa, Brilinta, or Effient? no Aspirin? no  Patient confirms/reports the following medications:  Current Outpatient Prescriptions  Medication Sig Dispense Refill  . aspirin EC 81 MG tablet Take 1 tablet by mouth daily.    . cetirizine (ZYRTEC) 10 MG tablet Take 10 mg by mouth daily.    . pantoprazole (PROTONIX) 40 MG tablet Take 1 tablet (40 mg total) by mouth daily. 90 tablet 0  . simvastatin (ZOCOR) 20 MG tablet Take 20 mg by mouth daily.    . timolol (TIMOPTIC) 0.5 % ophthalmic solution 1 drop 2 (two) times daily. Eye Dr     No current facility-administered medications for this visit.     Patient confirms/reports the following allergies:  No Known Allergies  No orders of the defined types were placed in this encounter.   AUTHORIZATION INFORMATION Primary Insurance: 1D#: Group #:  Secondary Insurance: 1D#: Group #:  SCHEDULE INFORMATION: Date: 04/01/17 Time: Location:[facility]

## 2017-03-24 ENCOUNTER — Encounter: Payer: Self-pay | Admitting: *Deleted

## 2017-04-01 ENCOUNTER — Encounter
Admission: RE | Disposition: A | Discharge status: Home / Self Care | Payer: Self-pay | Source: Ambulatory Visit | Attending: Gastroenterology

## 2017-04-01 ENCOUNTER — Ambulatory Visit: Payer: Medicare HMO | Admitting: Anesthesiology

## 2017-04-01 ENCOUNTER — Ambulatory Visit
Admission: RE | Admit: 2017-04-01 | Discharge: 2017-04-01 | Disposition: A | Discharge status: Home / Self Care | Payer: Medicare HMO | Source: Ambulatory Visit | Attending: Gastroenterology | Admitting: Gastroenterology

## 2017-04-01 DIAGNOSIS — Z7982 Long term (current) use of aspirin: Secondary | ICD-10-CM | POA: Diagnosis not present

## 2017-04-01 DIAGNOSIS — Z1211 Encounter for screening for malignant neoplasm of colon: Secondary | ICD-10-CM | POA: Diagnosis present

## 2017-04-01 DIAGNOSIS — E785 Hyperlipidemia, unspecified: Secondary | ICD-10-CM | POA: Insufficient documentation

## 2017-04-01 DIAGNOSIS — Z8249 Family history of ischemic heart disease and other diseases of the circulatory system: Secondary | ICD-10-CM | POA: Diagnosis not present

## 2017-04-01 DIAGNOSIS — Z79899 Other long term (current) drug therapy: Secondary | ICD-10-CM | POA: Insufficient documentation

## 2017-04-01 DIAGNOSIS — F17290 Nicotine dependence, other tobacco product, uncomplicated: Secondary | ICD-10-CM | POA: Insufficient documentation

## 2017-04-01 DIAGNOSIS — K219 Gastro-esophageal reflux disease without esophagitis: Secondary | ICD-10-CM | POA: Diagnosis not present

## 2017-04-01 DIAGNOSIS — K641 Second degree hemorrhoids: Secondary | ICD-10-CM | POA: Diagnosis not present

## 2017-04-01 DIAGNOSIS — R69 Illness, unspecified: Secondary | ICD-10-CM | POA: Diagnosis not present

## 2017-04-01 HISTORY — PX: COLONOSCOPY WITH PROPOFOL: SHX5780

## 2017-04-01 HISTORY — DX: Presence of dental prosthetic device (complete) (partial): Z97.2

## 2017-04-01 SURGERY — COLONOSCOPY WITH PROPOFOL
Anesthesia: General

## 2017-04-01 MED ORDER — PROPOFOL 10 MG/ML IV BOLUS
INTRAVENOUS | Status: DC | PRN
  Administered 2017-04-01: 50 mg via INTRAVENOUS
  Administered 2017-04-01: 20 mg via INTRAVENOUS
  Administered 2017-04-01: 50 mg via INTRAVENOUS
  Administered 2017-04-01: 20 mg via INTRAVENOUS
  Administered 2017-04-01: 50 mg via INTRAVENOUS

## 2017-04-01 MED ORDER — ACETAMINOPHEN 160 MG/5ML PO SOLN: 325.0000 mg | ORAL | Status: DC | PRN

## 2017-04-01 MED ORDER — LIDOCAINE HCL (CARDIAC) 20 MG/ML IV SOLN
INTRAVENOUS | Status: DC | PRN
  Administered 2017-04-01: 50 mg via INTRAVENOUS

## 2017-04-01 MED ORDER — ACETAMINOPHEN 325 MG PO TABS: 325.0000 mg | ORAL_TABLET | ORAL | Status: DC | PRN

## 2017-04-01 MED ORDER — LACTATED RINGERS IV SOLN
INTRAVENOUS | Status: DC
  Administered 2017-04-01: 08:00:00 via INTRAVENOUS

## 2017-04-01 MED ORDER — STERILE WATER FOR IRRIGATION IR SOLN
Status: DC | PRN
  Administered 2017-04-01: 09:00:00

## 2017-04-01 SURGICAL SUPPLY — 21 items
CANISTER SUCT 1200ML W/VALVE (MISCELLANEOUS) ×2
CLIP HMST 235XBRD CATH ROT (MISCELLANEOUS)
CLIP RESOLUTION 360 11X235 (MISCELLANEOUS)
FCP ESCP3.2XJMB 240X2.8X (MISCELLANEOUS)
FORCEPS BIOP RAD 4 LRG CAP 4 (CUTTING FORCEPS)
FORCEPS BIOP RJ4 240 W/NDL (MISCELLANEOUS)
GOWN CVR UNV OPN BCK APRN NK (MISCELLANEOUS) ×2
GOWN ISOL THUMB LOOP REG UNIV (MISCELLANEOUS) ×2
KIT DEFENDO VALVE AND CONN (KITS)
KIT ENDO PROCEDURE OLY (KITS) ×2
MARKER SPOT ENDO TATTOO 5ML (MISCELLANEOUS)
PAD GROUND ADULT SPLIT (MISCELLANEOUS)
PROBE APC STR FIRE (PROBE)
RETRIEVER NET ROTH 2.5X230 LF (MISCELLANEOUS)
SNARE SHORT THROW 13M SML OVAL (MISCELLANEOUS)
SNARE SHORT THROW 30M LRG OVAL (MISCELLANEOUS)
SNARE SNG USE RND 15MM (INSTRUMENTS)
SPOT EX ENDOSCOPIC TATTOO (MISCELLANEOUS)
TRAP ETRAP POLY (MISCELLANEOUS)
VARIJECT INJECTOR VIN23 (MISCELLANEOUS)
WATER STERILE IRR 250ML POUR (IV SOLUTION) ×2

## 2017-04-01 NOTE — Op Note (Signed)
[facility] Gastroenterology Patient Name: Miguel Adams Procedure Date: 04/01/2017 8:40 AM MRN: [id number] Account #: [id number] Date of Birth: 08-25-1946 Admit Type: Outpatient Age: 71 Room: [facility] OR ROOM 01 Gender: Male Note Status: Finalized Procedure:            Colonoscopy Indications:          Screening for colorectal malignant neoplasm Providers:            Lucilla Lame MD, MD Referring MD:         Juline Patch, MD (Referring MD) Medicines:            Propofol per Anesthesia Complications:        No immediate complications. Procedure:            Pre-Anesthesia Assessment:                       - Prior to the procedure, a History and Physical was                        performed, and patient medications and allergies were                        reviewed. The patient's tolerance of previous                        anesthesia was also reviewed. The risks and benefits of                        the procedure and the sedation options and risks were                        discussed with the patient. All questions were                        answered, and informed consent was obtained. Prior                        Anticoagulants: The patient has taken no previous                        anticoagulant or antiplatelet agents. ASA Grade                        Assessment: II - A patient with mild systemic disease.                        After reviewing the risks and benefits, the patient was                        deemed in satisfactory condition to undergo the                        procedure.                       After obtaining informed consent, the colonoscope was                        passed under direct vision. Throughout the procedure,  the patient's blood pressure, pulse, and oxygen                        saturations were monitored continuously. The [place] (S#: [id number]) was introduced  through                        the anus and advanced to the the cecum, identified by                        appendiceal orifice and ileocecal valve. The                        colonoscopy was performed without difficulty. The                        patient tolerated the procedure well. The quality of                        the bowel preparation was excellent. Findings:      The perianal and digital rectal examinations were normal.      Non-bleeding internal hemorrhoids were found during retroflexion. The       hemorrhoids were Grade II (internal hemorrhoids that prolapse but reduce       spontaneously). Impression:           - Non-bleeding internal hemorrhoids.                       - No specimens collected. Recommendation:       - Discharge patient to home.                       - Resume previous diet.                       - Continue present medications.                       - Repeat colonoscopy in 10 years for screening unless                        any change in family history or lower GI problems. Procedure Code(s):    --- Professional ---                       [phone], Colonoscopy, flexible; diagnostic, including                        collection of specimen(s) by brushing or washing, when                        performed (separate procedure) Diagnosis Code(s):    --- Professional ---                       Z12.11, Encounter for screening for malignant neoplasm                        of colon CPT copyright 2016 [employer]. All rights reserved. The  codes documented in this report are preliminary and upon coder review may  be revised to meet current compliance requirements. Lucilla Lame MD, MD 04/01/2017 8:57:14 AM This report has been signed electronically. Number of Addenda: 0 Note Initiated On: 04/01/2017 8:40 AM Scope Withdrawal Time: 0 hours 7 minutes 18 seconds  Total Procedure Duration: 0 hours 14 minutes 50 seconds       [facility]

## 2017-04-01 NOTE — Anesthesia Preprocedure Evaluation (Signed)
Anesthesia Evaluation  Patient identified by MRN, date of birth, ID band Patient awake    Reviewed: Allergy & Precautions, H&P , NPO status , Patient's Chart, lab work & pertinent test results  Airway Mallampati: II  TM Distance: >3 FB Neck ROM: full    Dental no notable dental hx.    Pulmonary Current Smoker,    Pulmonary exam normal breath sounds clear to auscultation       Cardiovascular Normal cardiovascular exam Rhythm:regular Rate:Normal     Neuro/Psych    GI/Hepatic GERD  ,  Endo/Other    Renal/GU      Musculoskeletal   Abdominal   Peds  Hematology   Anesthesia Other Findings   Reproductive/Obstetrics                             Anesthesia Physical Anesthesia Plan  ASA: II  Anesthesia Plan: General   Post-op Pain Management:    Induction:   PONV Risk Score and Plan: 2 and Propofol  Airway Management Planned:   Additional Equipment:   Intra-op Plan:   Post-operative Plan:   Informed Consent: I have reviewed the patients History and Physical, chart, labs and discussed the procedure including the risks, benefits and alternatives for the proposed anesthesia with the patient or authorized representative who has indicated his/her understanding and acceptance.     Plan Discussed with: CRNA  Anesthesia Plan Comments:         Anesthesia Quick Evaluation
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
## 2017-04-01 NOTE — Anesthesia Postprocedure Evaluation (Signed)
Anesthesia Post Note  Patient: Miguel Adams  Procedure(s) Performed: Procedure(s) (LRB): COLONOSCOPY WITH PROPOFOL (N/A)  Patient location during evaluation: PACU Anesthesia Type: General Level of consciousness: awake and alert and oriented Pain management: satisfactory to patient Vital Signs Assessment: post-procedure vital signs reviewed and stable Respiratory status: spontaneous breathing, nonlabored ventilation and respiratory function stable Cardiovascular status: blood pressure returned to baseline and stable Postop Assessment: Adequate PO intake and No signs of nausea or vomiting Anesthetic complications: no    Raliegh Ip

## 2017-04-01 NOTE — Transfer of Care (Signed)
Immediate Anesthesia Transfer of Care Note  Patient: Miguel Adams  Procedure(s) Performed: Procedure(s): COLONOSCOPY WITH PROPOFOL (N/A)  Patient Location: PACU  Anesthesia Type: General  Level of Consciousness: awake, alert  and patient cooperative  Airway and Oxygen Therapy: Patient Spontanous Breathing and Patient connected to supplemental oxygen  Post-op Assessment: Post-op Vital signs reviewed, Patient's Cardiovascular Status Stable, Respiratory Function Stable, Patent Airway and No signs of Nausea or vomiting  Post-op Vital Signs: Reviewed and stable  Complications: No apparent anesthesia complications

## 2017-04-01 NOTE — Anesthesia Procedure Notes (Signed)
Performed by: Khrystian Schauf Pre-anesthesia Checklist: Patient identified, Emergency Drugs available, Suction available, Timeout performed and Patient being monitored Patient Re-evaluated:Patient Re-evaluated prior to induction Oxygen Delivery Method: Nasal cannula Placement Confirmation: positive ETCO2       

## 2017-04-02 ENCOUNTER — Encounter: Payer: Self-pay | Admitting: Gastroenterology

## 2017-04-05 DIAGNOSIS — H401131 Primary open-angle glaucoma, bilateral, mild stage: Secondary | ICD-10-CM | POA: Diagnosis not present

## 2017-04-06 DIAGNOSIS — R69 Illness, unspecified: Secondary | ICD-10-CM | POA: Diagnosis not present

## 2017-04-19 DIAGNOSIS — H401131 Primary open-angle glaucoma, bilateral, mild stage: Secondary | ICD-10-CM | POA: Diagnosis not present

## 2017-05-20 DIAGNOSIS — R69 Illness, unspecified: Secondary | ICD-10-CM | POA: Diagnosis not present

## 2017-05-23 ENCOUNTER — Other Ambulatory Visit: Payer: Self-pay | Admitting: Family Medicine

## 2017-05-23 DIAGNOSIS — K219 Gastro-esophageal reflux disease without esophagitis: Secondary | ICD-10-CM

## 2017-08-08 ENCOUNTER — Other Ambulatory Visit: Payer: Self-pay | Admitting: Family Medicine

## 2017-08-08 DIAGNOSIS — K219 Gastro-esophageal reflux disease without esophagitis: Secondary | ICD-10-CM

## 2017-09-27 ENCOUNTER — Encounter: Payer: Self-pay | Admitting: Family Medicine

## 2017-09-27 ENCOUNTER — Ambulatory Visit (INDEPENDENT_AMBULATORY_CARE_PROVIDER_SITE_OTHER): Payer: Medicare HMO | Admitting: Family Medicine

## 2017-09-27 VITALS — BP 140/80 | HR 64 | Ht 71.0 in | Wt 199.0 lb

## 2017-09-27 DIAGNOSIS — E785 Hyperlipidemia, unspecified: Secondary | ICD-10-CM | POA: Diagnosis not present

## 2017-09-27 DIAGNOSIS — M7591 Shoulder lesion, unspecified, right shoulder: Secondary | ICD-10-CM

## 2017-09-27 DIAGNOSIS — K219 Gastro-esophageal reflux disease without esophagitis: Secondary | ICD-10-CM | POA: Diagnosis not present

## 2017-09-27 MED ORDER — SIMVASTATIN 20 MG PO TABS: 20.0000 mg | ORAL_TABLET | Freq: Every day | ORAL | 1 refills | Status: DC

## 2017-09-27 MED ORDER — PANTOPRAZOLE SODIUM 40 MG PO TBEC: 40.0000 mg | DELAYED_RELEASE_TABLET | Freq: Every day | ORAL | 1 refills | Status: DC

## 2017-09-27 MED ORDER — MELOXICAM 15 MG PO TABS: 15.0000 mg | ORAL_TABLET | Freq: Every day | ORAL | 0 refills | Status: DC
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
## 2017-09-28 LAB — LIPID PANEL
Chol/HDL Ratio: 4.5 ratio (ref 0.0–5.0)
Cholesterol, Total: 167 mg/dL (ref 100–199)
HDL: 37 mg/dL — ABNORMAL LOW
LDL Calculated: 83 mg/dL (ref 0–99)
Triglycerides: 237 mg/dL — ABNORMAL HIGH (ref 0–149)
VLDL Cholesterol Cal: 47 mg/dL — ABNORMAL HIGH (ref 5–40)

## 2017-10-01 ENCOUNTER — Ambulatory Visit: Payer: Medicare HMO | Admitting: Family Medicine

## 2017-10-07 DIAGNOSIS — R69 Illness, unspecified: Secondary | ICD-10-CM | POA: Diagnosis not present

## 2017-10-18 DIAGNOSIS — H401131 Primary open-angle glaucoma, bilateral, mild stage: Secondary | ICD-10-CM | POA: Diagnosis not present

## 2017-10-21 ENCOUNTER — Other Ambulatory Visit: Payer: Self-pay

## 2018-03-02 ENCOUNTER — Ambulatory Visit: Payer: Medicare HMO

## 2018-03-07 ENCOUNTER — Ambulatory Visit (INDEPENDENT_AMBULATORY_CARE_PROVIDER_SITE_OTHER): Payer: Medicare HMO

## 2018-03-07 VITALS — BP 148/72 | HR 56 | Temp 98.0°F | Resp 12 | Ht 71.0 in | Wt 203.8 lb

## 2018-03-07 DIAGNOSIS — Z0000 Encounter for general adult medical examination without abnormal findings: Secondary | ICD-10-CM | POA: Diagnosis not present

## 2018-03-07 NOTE — Patient Instructions (Signed)
Miguel Adams , Thank you for taking time to come for your Medicare Wellness Visit. I appreciate your ongoing commitment to your health goals. Please review the following plan we discussed and let me know if I can assist you in the future.   Screening recommendations/referrals: Colorectal Screening: Up to date  Vision and Dental Exams: Recommended annual ophthalmology exams for early detection of glaucoma and other disorders of the eye Recommended annual dental exams for proper oral hygiene  Vaccinations: Influenza vaccine: Up to date Pneumococcal vaccine: Up to date Tdap vaccine: Declined. Please call your insurance company to determine your out of pocket expense. You may also receive this vaccine at your local pharmacy or Health Dept. Shingles vaccine: Please call your insurance company to determine your out of pocket expense for the Shingrix vaccine. You may also receive this vaccine at your local pharmacy or Health Dept.  Advanced directives: Please bring a copy of your POA (Power of Attorney) and/or Living Will to your next appointment.  Goals: Recommend to drink at least 6-8 8oz glasses of water per day.  Next appointment: Please schedule your Annual Wellness Visit with your Nurse Health Advisor in one year.  Preventive Care 39 Years and Older, Male Preventive care refers to lifestyle choices and visits with your health care provider that can promote health and wellness. What does preventive care include?  A yearly physical exam. This is also called an annual well check.  Dental exams once or twice a year.  Routine eye exams. Ask your health care provider how often you should have your eyes checked.  Personal lifestyle choices, including:  Daily care of your teeth and gums.  Regular physical activity.  Eating a healthy diet.  Avoiding tobacco and drug use.  Limiting alcohol use.  Practicing safe sex.  Taking low doses of aspirin every day.  Taking vitamin and  mineral supplements as recommended by your health care provider. What happens during an annual well check? The services and screenings done by your health care provider during your annual well check will depend on your age, overall health, lifestyle risk factors, and family history of disease. Counseling  Your health care provider may ask you questions about your:  Alcohol use.  Tobacco use.  Drug use.  Emotional well-being.  Home and relationship well-being.  Sexual activity.  Eating habits.  History of falls.  Memory and ability to understand (cognition).  Work and work Statistician. Screening  You may have the following tests or measurements:  Height, weight, and BMI.  Blood pressure.  Lipid and cholesterol levels. These may be checked every 5 years, or more frequently if you are over 70 years old.  Skin check.  Lung cancer screening. You may have this screening every year starting at age 70 if you have a 30-pack-year history of smoking and currently smoke or have quit within the past 15 years.  Fecal occult blood test (FOBT) of the stool. You may have this test every year starting at age 70.  Flexible sigmoidoscopy or colonoscopy. You may have a sigmoidoscopy every 5 years or a colonoscopy every 10 years starting at age 70.  Prostate cancer screening. Recommendations will vary depending on your family history and other risks.  Hepatitis C blood test.  Hepatitis B blood test.  Sexually transmitted disease (STD) testing.  Diabetes screening. This is done by checking your blood sugar (glucose) after you have not eaten for a while (fasting). You may have this done every 1-3 years.  Abdominal  aortic aneurysm (AAA) screening. You may need this if you are a current or former smoker.  Osteoporosis. You may be screened starting at age 67 if you are at high risk. Talk with your health care provider about your test results, treatment options, and if necessary, the need  for more tests. Vaccines  Your health care provider may recommend certain vaccines, such as:  Influenza vaccine. This is recommended every year.  Tetanus, diphtheria, and acellular pertussis (Tdap, Td) vaccine. You may need a Td booster every 10 years.  Zoster vaccine. You may need this after age 70.  Pneumococcal 13-valent conjugate (PCV13) vaccine. One dose is recommended after age 70.  Pneumococcal polysaccharide (PPSV23) vaccine. One dose is recommended after age 70. Talk to your health care provider about which screenings and vaccines you need and how often you need them. This information is not intended to replace advice given to you by your health care provider. Make sure you discuss any questions you have with your health care provider. Document Released: 09/27/2015 Document Revised: 05/20/2016 Document Reviewed: 07/02/2015 Elsevier Interactive Patient Education  2017 [employer] Prevention in the Home Falls can cause injuries. They can happen to people of all ages. There are many things you can do to make your home safe and to help prevent falls. What can I do on the outside of my home?  Regularly fix the edges of walkways and driveways and fix any cracks.  Remove anything that might make you trip as you walk through a door, such as a raised step or threshold.  Trim any bushes or trees on the path to your home.  Use bright outdoor lighting.  Clear any walking paths of anything that might make someone trip, such as rocks or tools.  Regularly check to see if handrails are loose or broken. Make sure that both sides of any steps have handrails.  Any raised decks and porches should have guardrails on the edges.  Have any leaves, snow, or ice cleared regularly.  Use sand or salt on walking paths during winter.  Clean up any spills in your garage right away. This includes oil or grease spills. What can I do in the bathroom?  Use night lights.  Install grab bars  by the toilet and in the tub and shower. Do not use towel bars as grab bars.  Use non-skid mats or decals in the tub or shower.  If you need to sit down in the shower, use a plastic, non-slip stool.  Keep the floor dry. Clean up any water that spills on the floor as soon as it happens.  Remove soap buildup in the tub or shower regularly.  Attach bath mats securely with double-sided non-slip rug tape.  Do not have throw rugs and other things on the floor that can make you trip. What can I do in the bedroom?  Use night lights.  Make sure that you have a light by your bed that is easy to reach.  Do not use any sheets or blankets that are too big for your bed. They should not hang down onto the floor.  Have a firm chair that has side arms. You can use this for support while you get dressed.  Do not have throw rugs and other things on the floor that can make you trip. What can I do in the kitchen?  Clean up any spills right away.  Avoid walking on wet floors.  Keep items that you use a  lot in easy-to-reach places.  If you need to reach something above you, use a strong step stool that has a grab bar.  Keep electrical cords out of the way.  Do not use floor polish or wax that makes floors slippery. If you must use wax, use non-skid floor wax.  Do not have throw rugs and other things on the floor that can make you trip. What can I do with my stairs?  Do not leave any items on the stairs.  Make sure that there are handrails on both sides of the stairs and use them. Fix handrails that are broken or loose. Make sure that handrails are as long as the stairways.  Check any carpeting to make sure that it is firmly attached to the stairs. Fix any carpet that is loose or worn.  Avoid having throw rugs at the top or bottom of the stairs. If you do have throw rugs, attach them to the floor with carpet tape.  Make sure that you have a light switch at the top of the stairs and the  bottom of the stairs. If you do not have them, ask someone to add them for you. What else can I do to help prevent falls?  Wear shoes that:  Do not have high heels.  Have rubber bottoms.  Are comfortable and fit you well.  Are closed at the toe. Do not wear sandals.  If you use a stepladder:  Make sure that it is fully opened. Do not climb a closed stepladder.  Make sure that both sides of the stepladder are locked into place.  Ask someone to hold it for you, if possible.  Clearly mark and make sure that you can see:  Any grab bars or handrails.  First and last steps.  Where the edge of each step is.  Use tools that help you move around (mobility aids) if they are needed. These include:  Canes.  Walkers.  Scooters.  Crutches.  Turn on the lights when you go into a dark area. Replace any light bulbs as soon as they burn out.  Set up your furniture so you have a clear path. Avoid moving your furniture around.  If any of your floors are uneven, fix them.  If there are any pets around you, be aware of where they are.  Review your medicines with your doctor. Some medicines can make you feel dizzy. This can increase your chance of falling. Ask your doctor what other things that you can do to help prevent falls. This information is not intended to replace advice given to you by your health care provider. Make sure you discuss any questions you have with your health care provider. Document Released: 06/27/2009 Document Revised: 02/06/2016 Document Reviewed: 10/05/2014 Elsevier Interactive Patient Education  2017 [employer].
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
## 2018-03-30 ENCOUNTER — Encounter: Payer: Self-pay | Admitting: Family Medicine

## 2018-03-30 ENCOUNTER — Ambulatory Visit (INDEPENDENT_AMBULATORY_CARE_PROVIDER_SITE_OTHER): Payer: Medicare HMO | Admitting: Family Medicine

## 2018-03-30 VITALS — BP 138/88 | HR 68 | Ht 71.0 in | Wt 198.0 lb

## 2018-03-30 DIAGNOSIS — Z0000 Encounter for general adult medical examination without abnormal findings: Secondary | ICD-10-CM

## 2018-03-30 DIAGNOSIS — K219 Gastro-esophageal reflux disease without esophagitis: Secondary | ICD-10-CM | POA: Diagnosis not present

## 2018-03-30 DIAGNOSIS — M1990 Unspecified osteoarthritis, unspecified site: Secondary | ICD-10-CM | POA: Diagnosis not present

## 2018-03-30 DIAGNOSIS — E785 Hyperlipidemia, unspecified: Secondary | ICD-10-CM | POA: Diagnosis not present

## 2018-03-30 DIAGNOSIS — D229 Melanocytic nevi, unspecified: Secondary | ICD-10-CM | POA: Diagnosis not present

## 2018-03-30 MED ORDER — SIMVASTATIN 20 MG PO TABS: 20.0000 mg | ORAL_TABLET | Freq: Every day | ORAL | 3 refills | Status: DC

## 2018-03-30 MED ORDER — PANTOPRAZOLE SODIUM 40 MG PO TBEC: 40.0000 mg | DELAYED_RELEASE_TABLET | Freq: Every day | ORAL | 3 refills | Status: DC

## 2018-03-30 MED ORDER — MELOXICAM 15 MG PO TABS: 15.0000 mg | ORAL_TABLET | Freq: Every day | ORAL | 0 refills | Status: DC
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
## 2018-03-30 NOTE — Assessment & Plan Note (Signed)
Chronic Controlled Continue Simvastatin 20 mg daily. Willl check lipid panel.

## 2018-03-30 NOTE — Assessment & Plan Note (Signed)
Chronic Controlled continue pantoprazole 40 mg daily

## 2018-03-31 LAB — LIPID PANEL
Chol/HDL Ratio: 3.5 ratio (ref 0.0–5.0)
Cholesterol, Total: 142 mg/dL (ref 100–199)
HDL: 41 mg/dL
LDL Calculated: 85 mg/dL (ref 0–99)
Triglycerides: 82 mg/dL (ref 0–149)
VLDL Cholesterol Cal: 16 mg/dL (ref 5–40)

## 2018-03-31 LAB — RENAL FUNCTION PANEL
Albumin: 4.5 g/dL (ref 3.6–4.8)
BUN/Creatinine Ratio: 17 (ref 10–24)
BUN: 17 mg/dL (ref 8–27)
CO2: 23 mmol/L (ref 20–29)
Calcium: 9.6 mg/dL (ref 8.6–10.2)
Chloride: 104 mmol/L (ref 96–106)
Creatinine, Ser: 0.99 mg/dL (ref 0.76–1.27)
GFR calc Af Amer: 89 mL/min/{1.73_m2}
GFR calc non Af Amer: 77 mL/min/{1.73_m2}
Glucose: 98 mg/dL (ref 65–99)
Phosphorus: 3.3 mg/dL (ref 2.5–4.5)
Potassium: 4.4 mmol/L (ref 3.5–5.2)
Sodium: 145 mmol/L — ABNORMAL HIGH (ref 134–144)

## 2018-04-25 DIAGNOSIS — H401131 Primary open-angle glaucoma, bilateral, mild stage: Secondary | ICD-10-CM | POA: Diagnosis not present

## 2018-04-28 DIAGNOSIS — H401131 Primary open-angle glaucoma, bilateral, mild stage: Secondary | ICD-10-CM | POA: Diagnosis not present

## 2018-04-28 DIAGNOSIS — R69 Illness, unspecified: Secondary | ICD-10-CM | POA: Diagnosis not present

## 2018-05-11 DIAGNOSIS — L578 Other skin changes due to chronic exposure to nonionizing radiation: Secondary | ICD-10-CM | POA: Diagnosis not present

## 2018-05-11 DIAGNOSIS — Z1283 Encounter for screening for malignant neoplasm of skin: Secondary | ICD-10-CM | POA: Diagnosis not present

## 2018-05-11 DIAGNOSIS — L821 Other seborrheic keratosis: Secondary | ICD-10-CM | POA: Diagnosis not present

## 2018-05-19 ENCOUNTER — Ambulatory Visit (INDEPENDENT_AMBULATORY_CARE_PROVIDER_SITE_OTHER): Payer: Medicare HMO | Admitting: Family Medicine

## 2018-05-19 ENCOUNTER — Encounter: Payer: Self-pay | Admitting: Family Medicine

## 2018-05-19 VITALS — BP 130/72 | HR 64 | Ht 71.0 in | Wt 198.0 lb

## 2018-05-19 DIAGNOSIS — S838X2S Sprain of other specified parts of left knee, sequela: Secondary | ICD-10-CM | POA: Diagnosis not present

## 2018-05-19 DIAGNOSIS — R69 Illness, unspecified: Secondary | ICD-10-CM | POA: Diagnosis not present

## 2018-05-19 MED ORDER — TRAMADOL HCL 50 MG PO TABS: 50.0000 mg | ORAL_TABLET | Freq: Three times a day (TID) | ORAL | 0 refills | Status: DC | PRN
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
## 2018-05-23 ENCOUNTER — Other Ambulatory Visit: Payer: Self-pay

## 2018-05-23 ENCOUNTER — Telehealth: Payer: Self-pay

## 2018-05-23 MED ORDER — AZITHROMYCIN 250 MG PO TABS: ORAL_TABLET | ORAL | 0 refills | Status: DC

## 2018-05-23 NOTE — Telephone Encounter (Signed)
Patient has LM saying he will take [facility] up on offer to call in [place] to [place]. No need to call back as long as med is sent in today.

## 2018-05-23 NOTE — Telephone Encounter (Signed)
Pt wants ZPack called in- sent in

## 2018-05-24 DIAGNOSIS — M2392 Unspecified internal derangement of left knee: Secondary | ICD-10-CM | POA: Diagnosis not present

## 2018-05-24 DIAGNOSIS — M2352 Chronic instability of knee, left knee: Secondary | ICD-10-CM | POA: Diagnosis not present

## 2018-05-24 DIAGNOSIS — M25562 Pain in left knee: Secondary | ICD-10-CM | POA: Diagnosis not present

## 2018-05-24 DIAGNOSIS — M25462 Effusion, left knee: Secondary | ICD-10-CM | POA: Diagnosis not present

## 2018-05-27 ENCOUNTER — Telehealth: Payer: Self-pay

## 2018-05-27 MED ORDER — GUAIFENESIN-CODEINE 100-10 MG/5ML PO SYRP: 5.0000 mL | ORAL_SOLUTION | Freq: Three times a day (TID) | ORAL | 0 refills | Status: DC | PRN

## 2018-05-27 NOTE — Telephone Encounter (Signed)
Patient wants some hydrocodone cough meds for bedtime only. Has had in past and seen [facility] recently for cough.

## 2018-05-27 NOTE — Telephone Encounter (Signed)
Left message

## 2018-06-25 ENCOUNTER — Other Ambulatory Visit: Payer: Self-pay | Admitting: Family Medicine

## 2018-06-25 DIAGNOSIS — M1990 Unspecified osteoarthritis, unspecified site: Secondary | ICD-10-CM

## 2018-08-30 ENCOUNTER — Other Ambulatory Visit: Payer: Self-pay | Admitting: Orthopedic Surgery

## 2018-08-30 DIAGNOSIS — M25562 Pain in left knee: Principal | ICD-10-CM

## 2018-08-30 DIAGNOSIS — M25362 Other instability, left knee: Secondary | ICD-10-CM

## 2018-08-30 DIAGNOSIS — G8929 Other chronic pain: Secondary | ICD-10-CM

## 2018-08-30 DIAGNOSIS — M2392 Unspecified internal derangement of left knee: Secondary | ICD-10-CM

## 2018-09-20 ENCOUNTER — Ambulatory Visit
Admission: RE | Admit: 2018-09-20 | Discharge: 2018-09-20 | Disposition: A | Discharge status: Home / Self Care | Payer: Medicare HMO | Source: Ambulatory Visit | Attending: Orthopedic Surgery | Admitting: Orthopedic Surgery

## 2018-09-20 DIAGNOSIS — M25362 Other instability, left knee: Secondary | ICD-10-CM | POA: Insufficient documentation

## 2018-09-20 DIAGNOSIS — G8929 Other chronic pain: Secondary | ICD-10-CM | POA: Diagnosis not present

## 2018-09-20 DIAGNOSIS — M2392 Unspecified internal derangement of left knee: Secondary | ICD-10-CM | POA: Diagnosis not present

## 2018-09-20 DIAGNOSIS — M25562 Pain in left knee: Secondary | ICD-10-CM | POA: Insufficient documentation

## 2018-09-20 DIAGNOSIS — M23342 Other meniscus derangements, anterior horn of lateral meniscus, left knee: Secondary | ICD-10-CM | POA: Diagnosis not present

## 2018-09-29 ENCOUNTER — Other Ambulatory Visit: Payer: Self-pay

## 2018-09-29 ENCOUNTER — Encounter: Payer: Self-pay | Admitting: *Deleted

## 2018-09-29 DIAGNOSIS — S83282A Other tear of lateral meniscus, current injury, left knee, initial encounter: Secondary | ICD-10-CM | POA: Diagnosis not present

## 2018-09-29 DIAGNOSIS — S83242A Other tear of medial meniscus, current injury, left knee, initial encounter: Secondary | ICD-10-CM | POA: Diagnosis not present

## 2018-10-04 ENCOUNTER — Ambulatory Visit: Payer: Medicare HMO | Admitting: Anesthesiology

## 2018-10-04 ENCOUNTER — Encounter
Admission: RE | Disposition: A | Discharge status: Home / Self Care | Payer: Self-pay | Source: Home / Self Care | Attending: Orthopedic Surgery

## 2018-10-04 ENCOUNTER — Ambulatory Visit
Admission: RE | Admit: 2018-10-04 | Discharge: 2018-10-04 | Disposition: A | Discharge status: Home / Self Care | Payer: Medicare HMO | Attending: Orthopedic Surgery | Admitting: Orthopedic Surgery

## 2018-10-04 DIAGNOSIS — I10 Essential (primary) hypertension: Secondary | ICD-10-CM | POA: Diagnosis not present

## 2018-10-04 DIAGNOSIS — S83282A Other tear of lateral meniscus, current injury, left knee, initial encounter: Secondary | ICD-10-CM | POA: Diagnosis not present

## 2018-10-04 DIAGNOSIS — M65862 Other synovitis and tenosynovitis, left lower leg: Secondary | ICD-10-CM | POA: Diagnosis not present

## 2018-10-04 DIAGNOSIS — Z79899 Other long term (current) drug therapy: Secondary | ICD-10-CM | POA: Diagnosis not present

## 2018-10-04 DIAGNOSIS — M1712 Unilateral primary osteoarthritis, left knee: Secondary | ICD-10-CM | POA: Diagnosis not present

## 2018-10-04 DIAGNOSIS — M2242 Chondromalacia patellae, left knee: Secondary | ICD-10-CM | POA: Diagnosis not present

## 2018-10-04 DIAGNOSIS — S83242A Other tear of medial meniscus, current injury, left knee, initial encounter: Secondary | ICD-10-CM | POA: Diagnosis present

## 2018-10-04 DIAGNOSIS — K219 Gastro-esophageal reflux disease without esophagitis: Secondary | ICD-10-CM | POA: Insufficient documentation

## 2018-10-04 DIAGNOSIS — W2209XA Striking against other stationary object, initial encounter: Secondary | ICD-10-CM | POA: Diagnosis not present

## 2018-10-04 DIAGNOSIS — Z7982 Long term (current) use of aspirin: Secondary | ICD-10-CM | POA: Diagnosis not present

## 2018-10-04 DIAGNOSIS — M23042 Cystic meniscus, anterior horn of lateral meniscus, left knee: Secondary | ICD-10-CM | POA: Diagnosis not present

## 2018-10-04 HISTORY — PX: KNEE ARTHROSCOPY WITH MEDIAL MENISECTOMY: SHX5651

## 2018-10-04 SURGERY — KNEE ARTHROSCOPY WITH MEDIAL MENISECTOMY
Anesthesia: General | Site: Knee | Laterality: Left

## 2018-10-04 MED ORDER — FENTANYL CITRATE (PF) 100 MCG/2ML IJ SOLN
INTRAMUSCULAR | Status: DC | PRN
  Administered 2018-10-04: 50 ug via INTRAVENOUS
  Administered 2018-10-04: 12.5 ug via INTRAVENOUS
  Administered 2018-10-04: 25 ug via INTRAVENOUS
  Administered 2018-10-04: 12.5 ug via INTRAVENOUS
  Administered 2018-10-04: 25 ug via INTRAVENOUS

## 2018-10-04 MED ORDER — PROPOFOL 10 MG/ML IV BOLUS
INTRAVENOUS | Status: DC | PRN
  Administered 2018-10-04: 150 mg via INTRAVENOUS

## 2018-10-04 MED ORDER — LIDOCAINE HCL (CARDIAC) PF 100 MG/5ML IV SOSY
PREFILLED_SYRINGE | INTRAVENOUS | Status: DC | PRN
  Administered 2018-10-04: 40 mg via INTRATRACHEAL

## 2018-10-04 MED ORDER — LIDOCAINE-EPINEPHRINE 1 %-1:100000 IJ SOLN
INTRAMUSCULAR | Status: DC | PRN
  Administered 2018-10-04: 6 mL
  Administered 2018-10-04: 1.5 mL

## 2018-10-04 MED ORDER — DEXAMETHASONE SODIUM PHOSPHATE 4 MG/ML IJ SOLN
INTRAMUSCULAR | Status: DC | PRN
  Administered 2018-10-04: 4 mg via INTRAVENOUS

## 2018-10-04 MED ORDER — ASPIRIN EC 325 MG PO TBEC: 325.0000 mg | DELAYED_RELEASE_TABLET | Freq: Every day | ORAL | 0 refills | Status: AC

## 2018-10-04 MED ORDER — ONDANSETRON HCL 4 MG/2ML IJ SOLN
INTRAMUSCULAR | Status: DC | PRN
  Administered 2018-10-04: 4 mg via INTRAVENOUS

## 2018-10-04 MED ORDER — IBUPROFEN 800 MG PO TABS: 800.0000 mg | ORAL_TABLET | Freq: Three times a day (TID) | ORAL | 0 refills | Status: AC

## 2018-10-04 MED ORDER — FENTANYL CITRATE (PF) 100 MCG/2ML IJ SOLN: 25.0000 ug | INTRAMUSCULAR | Status: DC | PRN

## 2018-10-04 MED ORDER — ACETAMINOPHEN 500 MG PO TABS: 1000.0000 mg | ORAL_TABLET | Freq: Three times a day (TID) | ORAL | 2 refills | Status: DC

## 2018-10-04 MED ORDER — GLYCOPYRROLATE 0.2 MG/ML IJ SOLN
INTRAMUSCULAR | Status: DC | PRN
  Administered 2018-10-04: 0.1 mg via INTRAVENOUS

## 2018-10-04 MED ORDER — ONDANSETRON 4 MG PO TBDP: 4.0000 mg | ORAL_TABLET | Freq: Three times a day (TID) | ORAL | 0 refills | Status: DC | PRN

## 2018-10-04 MED ORDER — OXYCODONE HCL 5 MG PO TABS
5.0000 mg | ORAL_TABLET | Freq: Once | ORAL | Status: AC | PRN
  Administered 2018-10-04: 5 mg via ORAL

## 2018-10-04 MED ORDER — BUPIVACAINE HCL (PF) 0.5 % IJ SOLN
INTRAMUSCULAR | Status: DC | PRN
  Administered 2018-10-04: 6 mL
  Administered 2018-10-04: 1.5 mL

## 2018-10-04 MED ORDER — CEFAZOLIN SODIUM-DEXTROSE 2-4 GM/100ML-% IV SOLN: 2.0000 g | Freq: Once | INTRAVENOUS | Status: DC

## 2018-10-04 MED ORDER — ACETAMINOPHEN 10 MG/ML IV SOLN
1000.0000 mg | Freq: Once | INTRAVENOUS | Status: AC
  Administered 2018-10-04: 1000 mg via INTRAVENOUS

## 2018-10-04 MED ORDER — MIDAZOLAM HCL 5 MG/5ML IJ SOLN
INTRAMUSCULAR | Status: DC | PRN
  Administered 2018-10-04: 2 mg via INTRAVENOUS

## 2018-10-04 MED ORDER — OXYCODONE HCL 5 MG/5ML PO SOLN: 5.0000 mg | Freq: Once | ORAL | Status: AC | PRN

## 2018-10-04 MED ORDER — LACTATED RINGERS IV SOLN
10.0000 mL/h | INTRAVENOUS | Status: DC
  Administered 2018-10-04: 10 mL/h via INTRAVENOUS

## 2018-10-04 SURGICAL SUPPLY — 37 items
ADAPTER IRRIG TUBE 2 SPIKE SOL (ADAPTER) ×4
BLADE SURG SZ11 CARB STEEL (BLADE) ×2
BNDG COHESIVE 4X5 TAN STRL (GAUZE/BANDAGES/DRESSINGS) ×2
BNDG ESMARK 6X12 TAN STRL LF (GAUZE/BANDAGES/DRESSINGS) ×2
BUR RADIUS 3.5 (BURR)
BUR RADIUS 4.0X18.5 (BURR) ×2
CHLORAPREP W/TINT 26ML (MISCELLANEOUS) ×2
COOLER POLAR GLACIER W/PUMP (MISCELLANEOUS) ×2
COVER LIGHT HANDLE UNIVERSAL (MISCELLANEOUS) ×4
CUFF TOURN SGL QUICK 30 (TOURNIQUET CUFF) ×1
CUFF TRNQT CYL 30X4X21-28X (TOURNIQUET CUFF) ×1
DRAPE IMP U-DRAPE 54X76 (DRAPES) ×2
DRAPE U-SHAPE 48X52 POLY STRL (PACKS) ×2
GAUZE SPONGE 4X4 12PLY STRL (GAUZE/BANDAGES/DRESSINGS) ×2
GLOVE BIO SURGEON STRL SZ7.5 (GLOVE) ×4
GLOVE BIOGEL PI IND STRL 8 (GLOVE) ×2
GLOVE BIOGEL PI INDICATOR 8 (GLOVE) ×2
GOWN STRL REUS W/ TWL LRG LVL3 (GOWN DISPOSABLE) ×2
GOWN STRL REUS W/ TWL XL LVL3 (GOWN DISPOSABLE) ×1
GOWN STRL REUS W/TWL LRG LVL3 (GOWN DISPOSABLE) ×2
GOWN STRL REUS W/TWL XL LVL3 (GOWN DISPOSABLE) ×1
IV LACTATED RINGER IRRG 3000ML (IV SOLUTION) ×12
IV LR IRRIG 3000ML ARTHROMATIC (IV SOLUTION) ×12
KIT TURNOVER KIT A (KITS) ×2
MAT ABSORB  FLUID 56X50 GRAY (MISCELLANEOUS) ×1
MAT ABSORB FLUID 56X50 GRAY (MISCELLANEOUS) ×1
NEEDLE HYPO 22GX1.5 SAFETY (NEEDLE) ×2
NEPTUNE MANIFOLD (MISCELLANEOUS) ×2
PACK ARTHROSCOPY KNEE (MISCELLANEOUS) ×2
PADDING CAST BLEND 6X4 STRL (MISCELLANEOUS) ×1
PADDING STRL CAST 6IN (MISCELLANEOUS) ×1
SET TUBE SUCT SHAVER OUTFL 24K (TUBING) ×2
SET TUBE TIP INTRA-ARTICULAR (MISCELLANEOUS) ×2
SUT ETHILON 3-0 FS-10 30 BLK (SUTURE) ×2
TUBING ARTHRO INFLOW-ONLY STRL (TUBING) ×2
WAND WEREWOLF FLOW 90D (MISCELLANEOUS) ×2
WRAPON POLAR PAD KNEE (MISCELLANEOUS) ×2

## 2018-10-04 NOTE — Op Note (Signed)
Operative Note    SURGERY DATE: 10/04/2018   PRE-OP DIAGNOSIS:  1.  Left medial meniscus tear 2.  Left lateral meniscus tear 3.  Left lateral parameniscal cyst 4.  Left patella degenerative changes   POST-OP DIAGNOSIS:  1.  Left medial meniscus tear 2.  Left lateral meniscus tear 3.  Left lateral parameniscal cyst 4.  Left patella degenerative changes  PROCEDURES:  1. Left knee arthroscopy, partial medial meniscectomy (20% meniscus width of posterior horn/body junction) 2. Left knee partial lateral meniscectomy (horizontal tear of anterior horn, entire superior leaflet removed) 3. Left lateral parameniscal cyst excision with partial synovectomy of lateral compartment 4. Left chondroplasty of patellofemoral compartment   SURGEON: Cato Mulligan, MD   ANESTHESIA: Gen   ESTIMATED BLOOD LOSS: minimal   TOTAL IV FLUIDS: per anesthesia   INDICATION(S):  Miguel Adams is a 73 y.o. male with signs and symptoms as well as MRI finding of medial and lateral meniscus tears with associated lateral para meniscal cyst.  He had failed nonoperative management and was still symptomatic.  After discussion of risks, benefits, and alternatives to surgery, the patient elected to proceed.   OPERATIVE FINDINGS:    Examination under anesthesia: A careful examination under anesthesia was performed.  Passive range of motion was: Hyperextension: 2.  Extension: 0.  Flexion: 135.  Lachman: normal. Pivot Shift: normal.  Posterior drawer: normal.  Varus stability in full extension: normal.  Varus stability in 30 degrees of flexion: normal.  Valgus stability in full extension: normal.  Valgus stability in 30 degrees of flexion: normal.   Intra-operative findings: A thorough arthroscopic examination of the knee was performed.  The findings are: 1. Suprapatellar pouch: Normal 2. Undersurface of median ridge: Central area of grade 3 degenerative changes 3. Medial patellar facet: Grade 1-2 degenerative  changes 4. Lateral patellar facet: Area of grade 3 degenerative changes extending from the median ridge, otherwise areas of grade 1-2 degenerative changes 5. Trochlea: Normal 6. Lateral gutter/popliteus tendon: Normal 7. Hoffa's fat pad: Inflamed 8. Medial gutter/plica: Normal 9. ACL: Normal 10. PCL: Normal 11. Medial meniscus: Small oblique tear involving the posterior horn/body junction involving approximately 20% of the meniscus width 12. Medial compartment cartilage: Focal area of grade 2 degenerative changes of the medial femoral condyle, otherwise normal to grade 1 degenerative changes 13. Lateral meniscus: Horizontal tear of the anterior horn affecting the entire meniscus width.  There was an additional lateral parameniscal cyst anteriorly 14. Lateral compartment cartilage: Grade 1-2 degenerative changes of the tibial plateau   OPERATIVE REPORT:     I identified Miguel Adams in the pre-operative holding area. I marked the operative knee with my initials. I reviewed the risks and benefits of the proposed surgical intervention and the patient wished to proceed. The patient was transferred to the operative suite and placed in the supine position with all bony prominences padded.  Anesthesia was administered. Appropriate IV antibiotics were administered prior to incision. The extremity was then prepped and draped in standard fashion. A time out was performed confirming the correct extremity, correct patient, and correct procedure.   Arthroscopy portals were marked. Local anesthetic was injected to the planned portal sites. The anterolateral portal was established with an 11 blade.      The arthroscope was placed in the anterolateral portal and then into the suprapatellar pouch.  A diagnostic knee scope was completed with the above findings. The medial and lateral meniscus tears were identified. Next, the medial portal was established  under needle localization.    The lateral meniscus tear  of the anterior horn was debrided by removing the superior leaflet using combination of arthroscopic biters and oscillating shaver.  An accessory anterolateral portal was made to assist with visualization and instrumentation.  The para meniscal cyst was identified and decompressed using an oscillating shaver.  This included debridement/removal of a significant portion of the lateral compartment synovium and fat pad anterior to the lateral meniscus.  The meniscus was then confirmed to have stable borders.   Next, the MCL was pie-crusted to improve visualization of the posterior horn of the medial meniscus. The medial meniscal tear was debrided using an arthroscopic biter and an oscillating shaver until the meniscus had stable borders. A chondroplasty was performed of the patella such that there were stable cartilage edges without any loose fragments of cartilage. Arthroscopic fluid was removed from the joint.   The portals were closed with 3-0 Nylon suture. Sterile dressings included Xeroform, 4x4s, Sof-Rol, and Bias wrap. A Polarcare was placed.  The patient was then awakened and taken to the PACU hemodynamically stable without complication.     POSTOPERATIVE PLAN: The patient will be discharged home today once they meet PACU criteria. Aspirin 325 mg daily was prescribed for 2 weeks for DVT prophylaxis.  Physical therapy will start on POD#3-4. Weight-bearing as tolerated. Follow up in 2 weeks per protocol.

## 2018-10-04 NOTE — Anesthesia Preprocedure Evaluation (Signed)
Anesthesia Evaluation  Patient identified by MRN, date of birth, ID band Patient awake    Reviewed: Allergy & Precautions, NPO status , Patient's Chart, lab work & pertinent test results  Airway Mallampati: II  TM Distance: >3 FB     Dental   Pulmonary Current Smoker,    breath sounds clear to auscultation       Cardiovascular Exercise Tolerance: Good  Rhythm:Regular Rate:Normal  HLD   Neuro/Psych    GI/Hepatic GERD  ,  Endo/Other  negative endocrine ROS  Renal/GU      Musculoskeletal   Abdominal   Peds  Hematology   Anesthesia Other Findings   Reproductive/Obstetrics                             Anesthesia Physical Anesthesia Plan  ASA: II  Anesthesia Plan: General   Post-op Pain Management:    Induction: Intravenous  PONV Risk Score and Plan:   Airway Management Planned: LMA  Additional Equipment:   Intra-op Plan:   Post-operative Plan:   Informed Consent:   Plan Discussed with:   Anesthesia Plan Comments:         Anesthesia Quick Evaluation

## 2018-10-04 NOTE — Anesthesia Postprocedure Evaluation (Signed)
Anesthesia Post Note  Patient: Miguel Adams  Procedure(s) Performed: KNEE ARTHROSCOPY WITH PARTIAL  MEDIAL AND LATERAL MENISECTOMY PARAMENISCAL CYST EXCISION LIMITED SYNOVECTOMY (Left Knee)  Patient location during evaluation: PACU Anesthesia Type: General Level of consciousness: awake Pain management: pain level controlled Vital Signs Assessment: post-procedure vital signs reviewed and stable Respiratory status: respiratory function stable Cardiovascular status: stable Postop Assessment: no signs of nausea or vomiting Anesthetic complications: no    Veda Canning

## 2018-10-04 NOTE — Transfer of Care (Signed)
Immediate Anesthesia Transfer of Care Note  Patient: Miguel Adams  Procedure(s) Performed: KNEE ARTHROSCOPY WITH PARTIAL  MEDIAL AND LATERAL MENISECTOMY PARAMENISCAL CYST EXCISION LIMITED SYNOVECTOMY (Left Knee)  Patient Location: PACU  Anesthesia Type: General  Level of Consciousness: awake, alert  and patient cooperative  Airway and Oxygen Therapy: Patient Spontanous Breathing and Patient connected to supplemental oxygen  Post-op Assessment: Post-op Vital signs reviewed, Patient's Cardiovascular Status Stable, Respiratory Function Stable, Patent Airway and No signs of Nausea or vomiting  Post-op Vital Signs: Reviewed and stable  Complications: No apparent anesthesia complications

## 2018-10-04 NOTE — H&P (Signed)
Paper H&P to be scanned into permanent record. H&P reviewed. No significant changes noted.  

## 2018-10-04 NOTE — Discharge Instructions (Signed)
Arthroscopic Knee Surgery - Partial Meniscectomy   Post-Op Instructions   1. Bracing or crutches: Crutches will be provided at the time of discharge from the surgery center if you do not already have them.   2. Ice: You may be provided with a device [facility]) that allows you to ice the affected area effectively. Otherwise you can ice manually.    3. Driving:  Plan on not driving for at least 1-2 weeks. Please note that you are advised NOT to drive while taking narcotic pain medications as you may be impaired and unsafe to drive.   4. Activity: Ankle pumps several times an hour while awake to prevent blood clots. Weight bearing: as tolerated. Use crutches for as needed (usually ~1 week or less) until pain allows you to ambulate without a limp. Bending and straightening the knee is unlimited. Elevate knee above heart level as much as possible for one week. Avoid standing more than 5 minutes (consecutively) for the first week.  Avoid long distance travel for 2 weeks.  5. Medications:  - You may take 1-2 tablets of tramadol as narcotic-level pain medicine. After surgery, take 1-2 narcotic tablets every 6-8 hours if needed for severe pain.  - You may take up to 3000mg /day of tylenol (acetaminophen). You can take 1000mg  3x/day. Please check your narcotic. If you have acetaminophen in your narcotic (each tablet will be 325mg ), be careful not to exceed a total of 3000mg /day of acetaminophen.  - A prescription for anti-nausea medication will be provided in case the narcotic medicine or anesthesia causes nausea - take 1 tablet every 6 hours only if nauseated.  - Take ibuprofen 800 mg every 8 hours WITH food to reduce post-operative knee swelling. DO NOT STOP IBUPROFEN POST-OP UNTIL INSTRUCTED TO DO SO at first post-op office visit (10-14 days after surgery). However, please discontinue if you have any abdominal discomfort after taking this.  - Take enteric coated aspirin 325 mg once daily for 2 weeks to  prevent blood clots.    6. Bandages: The physical therapist should change the bandages at the first post-op appointment. If needed, the dressing supplies have been provided to you.   7. Physical Therapy: 1-2 times per week for 6 weeks. Therapy typically starts on post operative Day 3 or 4. You have been provided an order for physical therapy. The therapist will provide home exercises.   8. Work: May return to full work usually around 2 weeks after 1st post-operative visit. May do light duty/desk job in approximately 1-2 weeks when off of narcotics, pain is well-controlled, and swelling has decreased. Labor intensive jobs may require 4-6 weeks to return.      9. Post-Op Appointments: Your first post-op appointment will be with Dr. Posey Pronto in approximately 2 weeks time.    If you find that they have not been scheduled please call the Orthopaedic Appointment front desk at [phone].  General Anesthesia, Adult, Care After This sheet gives you information about how to care for yourself after your procedure. Your health care provider may also give you more specific instructions. If you have problems or questions, contact your health care provider. What can I expect after the procedure? After the procedure, the following side effects are common:  Pain or discomfort at the IV site.  Nausea.  Vomiting.  Sore throat.  Trouble concentrating.  Feeling cold or chills.  Weak or tired.  Sleepiness and fatigue.  Soreness and body aches. These side effects can affect parts of the  body that were not involved in surgery. Follow these instructions at home:  For at least 24 hours after the procedure:  Have a responsible adult stay with you. It is important to have someone help care for you until you are awake and alert.  Rest as needed.  Do not: ? Participate in activities in which you could fall or become injured. ? Drive. ? Use heavy machinery. ? Drink alcohol. ? Take sleeping pills  or medicines that cause drowsiness. ? Make important decisions or sign legal documents. ? Take care of children on your own. Eating and drinking  Follow any instructions from your health care provider about eating or drinking restrictions.  When you feel hungry, start by eating small amounts of foods that are soft and easy to digest (bland), such as toast. Gradually return to your regular diet.  Drink enough fluid to keep your urine pale yellow.  If you vomit, rehydrate by drinking water, juice, or clear broth. General instructions  If you have sleep apnea, surgery and certain medicines can increase your risk for breathing problems. Follow instructions from your health care provider about wearing your sleep device: ? Anytime you are sleeping, including during daytime naps. ? While taking prescription pain medicines, sleeping medicines, or medicines that make you drowsy.  Return to your normal activities as told by your health care provider. Ask your health care provider what activities are safe for you.  Take over-the-counter and prescription medicines only as told by your health care provider.  If you smoke, do not smoke without supervision.  Keep all follow-up visits as told by your health care provider. This is important. Contact a health care provider if:  You have nausea or vomiting that does not get better with medicine.  You cannot eat or drink without vomiting.  You have pain that does not get better with medicine.  You are unable to pass urine.  You develop a skin rash.  You have a fever.  You have redness around your IV site that gets worse. Get help right away if:  You have difficulty breathing.  You have chest pain.  You have blood in your urine or stool, or you vomit blood. Summary  After the procedure, it is common to have a sore throat or nausea. It is also common to feel tired.  Have a responsible adult stay with you for the first 24 hours after  general anesthesia. It is important to have someone help care for you until you are awake and alert.  When you feel hungry, start by eating small amounts of foods that are soft and easy to digest (bland), such as toast. Gradually return to your regular diet.  Drink enough fluid to keep your urine pale yellow.  Return to your normal activities as told by your health care provider. Ask your health care provider what activities are safe for you. This information is not intended to replace advice given to you by your health care provider. Make sure you discuss any questions you have with your health care provider. Document Released: 12/07/2000 Document Revised: 04/16/2017 Document Reviewed: 04/16/2017 Elsevier Interactive Patient Education  2019 [employer].

## 2018-10-04 NOTE — Anesthesia Procedure Notes (Signed)
Procedure Name: LMA Insertion Date/Time: 10/04/2018 1:01 PM Performed by: Mayme Genta, CRNA Pre-anesthesia Checklist: Patient identified, Emergency Drugs available, Suction available, Timeout performed and Patient being monitored Patient Re-evaluated:Patient Re-evaluated prior to induction Oxygen Delivery Method: Circle system utilized Preoxygenation: Pre-oxygenation with 100% oxygen Induction Type: IV induction LMA: LMA inserted LMA Size: 4.0 Number of attempts: 1 Placement Confirmation: positive ETCO2 and breath sounds checked- equal and bilateral Tube secured with: Tape

## 2018-10-05 ENCOUNTER — Encounter: Payer: Self-pay | Admitting: Orthopedic Surgery

## 2018-10-07 DIAGNOSIS — M25662 Stiffness of left knee, not elsewhere classified: Secondary | ICD-10-CM | POA: Diagnosis not present

## 2018-10-07 DIAGNOSIS — M6281 Muscle weakness (generalized): Secondary | ICD-10-CM | POA: Diagnosis not present

## 2018-10-07 DIAGNOSIS — S83282A Other tear of lateral meniscus, current injury, left knee, initial encounter: Secondary | ICD-10-CM | POA: Diagnosis not present

## 2018-10-07 DIAGNOSIS — M25562 Pain in left knee: Secondary | ICD-10-CM | POA: Diagnosis not present

## 2018-10-07 DIAGNOSIS — S83242A Other tear of medial meniscus, current injury, left knee, initial encounter: Secondary | ICD-10-CM | POA: Diagnosis not present

## 2018-10-12 DIAGNOSIS — M25662 Stiffness of left knee, not elsewhere classified: Secondary | ICD-10-CM | POA: Diagnosis not present

## 2018-10-12 DIAGNOSIS — S83242A Other tear of medial meniscus, current injury, left knee, initial encounter: Secondary | ICD-10-CM | POA: Diagnosis not present

## 2018-10-12 DIAGNOSIS — S83282A Other tear of lateral meniscus, current injury, left knee, initial encounter: Secondary | ICD-10-CM | POA: Diagnosis not present

## 2018-10-12 DIAGNOSIS — M25562 Pain in left knee: Secondary | ICD-10-CM | POA: Diagnosis not present

## 2018-10-12 DIAGNOSIS — M6281 Muscle weakness (generalized): Secondary | ICD-10-CM | POA: Diagnosis not present

## 2018-10-17 DIAGNOSIS — M25662 Stiffness of left knee, not elsewhere classified: Secondary | ICD-10-CM | POA: Diagnosis not present

## 2018-10-17 DIAGNOSIS — M6281 Muscle weakness (generalized): Secondary | ICD-10-CM | POA: Diagnosis not present

## 2018-10-17 DIAGNOSIS — S83242A Other tear of medial meniscus, current injury, left knee, initial encounter: Secondary | ICD-10-CM | POA: Diagnosis not present

## 2018-10-17 DIAGNOSIS — M25562 Pain in left knee: Secondary | ICD-10-CM | POA: Diagnosis not present

## 2018-10-17 DIAGNOSIS — S83282A Other tear of lateral meniscus, current injury, left knee, initial encounter: Secondary | ICD-10-CM | POA: Diagnosis not present

## 2018-10-19 DIAGNOSIS — S83242A Other tear of medial meniscus, current injury, left knee, initial encounter: Secondary | ICD-10-CM | POA: Diagnosis not present

## 2018-10-19 DIAGNOSIS — M25562 Pain in left knee: Secondary | ICD-10-CM | POA: Diagnosis not present

## 2018-10-19 DIAGNOSIS — M25662 Stiffness of left knee, not elsewhere classified: Secondary | ICD-10-CM | POA: Diagnosis not present

## 2018-10-19 DIAGNOSIS — Z9889 Other specified postprocedural states: Secondary | ICD-10-CM | POA: Insufficient documentation

## 2018-10-19 DIAGNOSIS — S83282A Other tear of lateral meniscus, current injury, left knee, initial encounter: Secondary | ICD-10-CM | POA: Diagnosis not present

## 2018-10-19 DIAGNOSIS — M6281 Muscle weakness (generalized): Secondary | ICD-10-CM | POA: Diagnosis not present

## 2018-10-24 DIAGNOSIS — S83242A Other tear of medial meniscus, current injury, left knee, initial encounter: Secondary | ICD-10-CM | POA: Diagnosis not present

## 2018-10-24 DIAGNOSIS — M25662 Stiffness of left knee, not elsewhere classified: Secondary | ICD-10-CM | POA: Diagnosis not present

## 2018-10-24 DIAGNOSIS — S83282A Other tear of lateral meniscus, current injury, left knee, initial encounter: Secondary | ICD-10-CM | POA: Diagnosis not present

## 2018-10-24 DIAGNOSIS — M25562 Pain in left knee: Secondary | ICD-10-CM | POA: Diagnosis not present

## 2018-10-24 DIAGNOSIS — M6281 Muscle weakness (generalized): Secondary | ICD-10-CM | POA: Diagnosis not present

## 2018-10-25 DIAGNOSIS — H401131 Primary open-angle glaucoma, bilateral, mild stage: Secondary | ICD-10-CM | POA: Diagnosis not present

## 2018-10-25 DIAGNOSIS — R69 Illness, unspecified: Secondary | ICD-10-CM | POA: Diagnosis not present

## 2018-10-26 DIAGNOSIS — M6281 Muscle weakness (generalized): Secondary | ICD-10-CM | POA: Diagnosis not present

## 2018-10-26 DIAGNOSIS — S83282A Other tear of lateral meniscus, current injury, left knee, initial encounter: Secondary | ICD-10-CM | POA: Diagnosis not present

## 2018-10-26 DIAGNOSIS — M25662 Stiffness of left knee, not elsewhere classified: Secondary | ICD-10-CM | POA: Diagnosis not present

## 2018-10-26 DIAGNOSIS — M25562 Pain in left knee: Secondary | ICD-10-CM | POA: Diagnosis not present

## 2018-10-26 DIAGNOSIS — S83242A Other tear of medial meniscus, current injury, left knee, initial encounter: Secondary | ICD-10-CM | POA: Diagnosis not present

## 2018-10-31 DIAGNOSIS — S83242A Other tear of medial meniscus, current injury, left knee, initial encounter: Secondary | ICD-10-CM | POA: Diagnosis not present

## 2018-10-31 DIAGNOSIS — M25662 Stiffness of left knee, not elsewhere classified: Secondary | ICD-10-CM | POA: Diagnosis not present

## 2018-10-31 DIAGNOSIS — S83282A Other tear of lateral meniscus, current injury, left knee, initial encounter: Secondary | ICD-10-CM | POA: Diagnosis not present

## 2018-10-31 DIAGNOSIS — M25562 Pain in left knee: Secondary | ICD-10-CM | POA: Diagnosis not present

## 2018-10-31 DIAGNOSIS — M6281 Muscle weakness (generalized): Secondary | ICD-10-CM | POA: Diagnosis not present

## 2018-11-02 DIAGNOSIS — M25562 Pain in left knee: Secondary | ICD-10-CM | POA: Diagnosis not present

## 2018-11-02 DIAGNOSIS — M6281 Muscle weakness (generalized): Secondary | ICD-10-CM | POA: Diagnosis not present

## 2018-11-02 DIAGNOSIS — S83282A Other tear of lateral meniscus, current injury, left knee, initial encounter: Secondary | ICD-10-CM | POA: Diagnosis not present

## 2018-11-02 DIAGNOSIS — M25662 Stiffness of left knee, not elsewhere classified: Secondary | ICD-10-CM | POA: Diagnosis not present

## 2018-11-02 DIAGNOSIS — S83242A Other tear of medial meniscus, current injury, left knee, initial encounter: Secondary | ICD-10-CM | POA: Diagnosis not present

## 2018-11-07 DIAGNOSIS — M25562 Pain in left knee: Secondary | ICD-10-CM | POA: Diagnosis not present

## 2018-11-07 DIAGNOSIS — M25662 Stiffness of left knee, not elsewhere classified: Secondary | ICD-10-CM | POA: Diagnosis not present

## 2018-11-07 DIAGNOSIS — S83282A Other tear of lateral meniscus, current injury, left knee, initial encounter: Secondary | ICD-10-CM | POA: Diagnosis not present

## 2018-11-07 DIAGNOSIS — S83242A Other tear of medial meniscus, current injury, left knee, initial encounter: Secondary | ICD-10-CM | POA: Diagnosis not present

## 2018-11-07 DIAGNOSIS — M6281 Muscle weakness (generalized): Secondary | ICD-10-CM | POA: Diagnosis not present

## 2018-11-09 DIAGNOSIS — S83282A Other tear of lateral meniscus, current injury, left knee, initial encounter: Secondary | ICD-10-CM | POA: Diagnosis not present

## 2018-11-09 DIAGNOSIS — M25662 Stiffness of left knee, not elsewhere classified: Secondary | ICD-10-CM | POA: Diagnosis not present

## 2018-11-09 DIAGNOSIS — M25562 Pain in left knee: Secondary | ICD-10-CM | POA: Diagnosis not present

## 2018-11-09 DIAGNOSIS — M6281 Muscle weakness (generalized): Secondary | ICD-10-CM | POA: Diagnosis not present

## 2018-11-09 DIAGNOSIS — S83242A Other tear of medial meniscus, current injury, left knee, initial encounter: Secondary | ICD-10-CM | POA: Diagnosis not present

## 2018-11-14 DIAGNOSIS — M6281 Muscle weakness (generalized): Secondary | ICD-10-CM | POA: Diagnosis not present

## 2018-11-14 DIAGNOSIS — S83242A Other tear of medial meniscus, current injury, left knee, initial encounter: Secondary | ICD-10-CM | POA: Diagnosis not present

## 2018-11-14 DIAGNOSIS — M25662 Stiffness of left knee, not elsewhere classified: Secondary | ICD-10-CM | POA: Diagnosis not present

## 2018-11-14 DIAGNOSIS — S83282A Other tear of lateral meniscus, current injury, left knee, initial encounter: Secondary | ICD-10-CM | POA: Diagnosis not present

## 2018-11-14 DIAGNOSIS — M25562 Pain in left knee: Secondary | ICD-10-CM | POA: Diagnosis not present

## 2018-11-17 DIAGNOSIS — M6281 Muscle weakness (generalized): Secondary | ICD-10-CM | POA: Diagnosis not present

## 2018-11-17 DIAGNOSIS — S83282A Other tear of lateral meniscus, current injury, left knee, initial encounter: Secondary | ICD-10-CM | POA: Diagnosis not present

## 2018-11-17 DIAGNOSIS — M25562 Pain in left knee: Secondary | ICD-10-CM | POA: Diagnosis not present

## 2018-11-17 DIAGNOSIS — M25662 Stiffness of left knee, not elsewhere classified: Secondary | ICD-10-CM | POA: Diagnosis not present

## 2018-11-17 DIAGNOSIS — S83242A Other tear of medial meniscus, current injury, left knee, initial encounter: Secondary | ICD-10-CM | POA: Diagnosis not present

## 2018-11-21 DIAGNOSIS — S83242A Other tear of medial meniscus, current injury, left knee, initial encounter: Secondary | ICD-10-CM | POA: Diagnosis not present

## 2018-11-21 DIAGNOSIS — S83282A Other tear of lateral meniscus, current injury, left knee, initial encounter: Secondary | ICD-10-CM | POA: Diagnosis not present

## 2018-11-21 DIAGNOSIS — M25562 Pain in left knee: Secondary | ICD-10-CM | POA: Diagnosis not present

## 2018-11-21 DIAGNOSIS — M25662 Stiffness of left knee, not elsewhere classified: Secondary | ICD-10-CM | POA: Diagnosis not present

## 2018-11-21 DIAGNOSIS — M6281 Muscle weakness (generalized): Secondary | ICD-10-CM | POA: Diagnosis not present

## 2018-11-23 DIAGNOSIS — M25662 Stiffness of left knee, not elsewhere classified: Secondary | ICD-10-CM | POA: Diagnosis not present

## 2018-11-23 DIAGNOSIS — M6281 Muscle weakness (generalized): Secondary | ICD-10-CM | POA: Diagnosis not present

## 2018-11-23 DIAGNOSIS — M25562 Pain in left knee: Secondary | ICD-10-CM | POA: Diagnosis not present

## 2018-11-23 DIAGNOSIS — S83282A Other tear of lateral meniscus, current injury, left knee, initial encounter: Secondary | ICD-10-CM | POA: Diagnosis not present

## 2018-11-23 DIAGNOSIS — S83242A Other tear of medial meniscus, current injury, left knee, initial encounter: Secondary | ICD-10-CM | POA: Diagnosis not present

## 2018-11-28 DIAGNOSIS — M6281 Muscle weakness (generalized): Secondary | ICD-10-CM | POA: Diagnosis not present

## 2018-11-28 DIAGNOSIS — S83282A Other tear of lateral meniscus, current injury, left knee, initial encounter: Secondary | ICD-10-CM | POA: Diagnosis not present

## 2018-11-28 DIAGNOSIS — M25562 Pain in left knee: Secondary | ICD-10-CM | POA: Diagnosis not present

## 2018-11-28 DIAGNOSIS — S83242A Other tear of medial meniscus, current injury, left knee, initial encounter: Secondary | ICD-10-CM | POA: Diagnosis not present

## 2018-11-28 DIAGNOSIS — M25662 Stiffness of left knee, not elsewhere classified: Secondary | ICD-10-CM | POA: Diagnosis not present

## 2018-11-30 DIAGNOSIS — M6281 Muscle weakness (generalized): Secondary | ICD-10-CM | POA: Diagnosis not present

## 2018-11-30 DIAGNOSIS — M25662 Stiffness of left knee, not elsewhere classified: Secondary | ICD-10-CM | POA: Diagnosis not present

## 2018-11-30 DIAGNOSIS — M25562 Pain in left knee: Secondary | ICD-10-CM | POA: Diagnosis not present

## 2018-11-30 DIAGNOSIS — S83242A Other tear of medial meniscus, current injury, left knee, initial encounter: Secondary | ICD-10-CM | POA: Diagnosis not present

## 2018-11-30 DIAGNOSIS — S83282A Other tear of lateral meniscus, current injury, left knee, initial encounter: Secondary | ICD-10-CM | POA: Diagnosis not present

## 2018-12-05 DIAGNOSIS — M25562 Pain in left knee: Secondary | ICD-10-CM | POA: Diagnosis not present

## 2018-12-05 DIAGNOSIS — S83282A Other tear of lateral meniscus, current injury, left knee, initial encounter: Secondary | ICD-10-CM | POA: Diagnosis not present

## 2018-12-05 DIAGNOSIS — M25662 Stiffness of left knee, not elsewhere classified: Secondary | ICD-10-CM | POA: Diagnosis not present

## 2018-12-05 DIAGNOSIS — M6281 Muscle weakness (generalized): Secondary | ICD-10-CM | POA: Diagnosis not present

## 2018-12-05 DIAGNOSIS — S83242A Other tear of medial meniscus, current injury, left knee, initial encounter: Secondary | ICD-10-CM | POA: Diagnosis not present

## 2018-12-19 DIAGNOSIS — S83282D Other tear of lateral meniscus, current injury, left knee, subsequent encounter: Secondary | ICD-10-CM | POA: Diagnosis not present

## 2018-12-19 DIAGNOSIS — S83242D Other tear of medial meniscus, current injury, left knee, subsequent encounter: Secondary | ICD-10-CM | POA: Diagnosis not present

## 2019-04-04 ENCOUNTER — Ambulatory Visit (INDEPENDENT_AMBULATORY_CARE_PROVIDER_SITE_OTHER): Payer: Medicare HMO | Admitting: Family Medicine

## 2019-04-04 ENCOUNTER — Encounter: Payer: Self-pay | Admitting: Family Medicine

## 2019-04-04 ENCOUNTER — Other Ambulatory Visit: Payer: Self-pay

## 2019-04-04 VITALS — BP 120/70 | HR 60 | Ht 71.0 in | Wt 195.0 lb

## 2019-04-04 DIAGNOSIS — Z0000 Encounter for general adult medical examination without abnormal findings: Secondary | ICD-10-CM | POA: Diagnosis not present

## 2019-04-04 DIAGNOSIS — Z1159 Encounter for screening for other viral diseases: Secondary | ICD-10-CM

## 2019-04-04 DIAGNOSIS — E782 Mixed hyperlipidemia: Secondary | ICD-10-CM | POA: Diagnosis not present

## 2019-04-04 DIAGNOSIS — R351 Nocturia: Secondary | ICD-10-CM | POA: Diagnosis not present
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
## 2019-04-05 ENCOUNTER — Other Ambulatory Visit: Payer: Self-pay | Admitting: Family Medicine

## 2019-04-05 DIAGNOSIS — K219 Gastro-esophageal reflux disease without esophagitis: Secondary | ICD-10-CM

## 2019-04-05 DIAGNOSIS — E785 Hyperlipidemia, unspecified: Secondary | ICD-10-CM

## 2019-04-05 LAB — PSA: Prostate Specific Ag, Serum: 0.5 ng/mL (ref 0.0–4.0)

## 2019-04-05 LAB — HEPATIC FUNCTION PANEL
ALT: 15 [IU]/L (ref 0–44)
AST: 16 [IU]/L (ref 0–40)
Alkaline Phosphatase: 66 [IU]/L (ref 39–117)
Bilirubin Total: 0.4 mg/dL (ref 0.0–1.2)
Bilirubin, Direct: 0.12 mg/dL (ref 0.00–0.40)
Total Protein: 6.9 g/dL (ref 6.0–8.5)

## 2019-04-05 LAB — LIPID PANEL WITH LDL/HDL RATIO
Cholesterol, Total: 155 mg/dL (ref 100–199)
HDL: 38 mg/dL — ABNORMAL LOW
LDL Calculated: 88 mg/dL (ref 0–99)
LDl/HDL Ratio: 2.3 ratio (ref 0.0–3.6)
Triglycerides: 146 mg/dL (ref 0–149)
VLDL Cholesterol Cal: 29 mg/dL (ref 5–40)

## 2019-04-05 LAB — HEPATITIS C ANTIBODY: Hep C Virus Ab: 0.1 {s_co_ratio} (ref 0.0–0.9)

## 2019-04-05 LAB — RENAL FUNCTION PANEL
Albumin: 4.6 g/dL (ref 3.8–4.8)
BUN/Creatinine Ratio: 17 (ref 10–24)
BUN: 19 mg/dL (ref 8–27)
CO2: 20 mmol/L (ref 20–29)
Calcium: 9.7 mg/dL (ref 8.6–10.2)
Chloride: 103 mmol/L (ref 96–106)
Creatinine, Ser: 1.14 mg/dL (ref 0.76–1.27)
GFR calc Af Amer: 75 mL/min/{1.73_m2}
GFR calc non Af Amer: 65 mL/min/{1.73_m2}
Glucose: 120 mg/dL — ABNORMAL HIGH (ref 65–99)
Phosphorus: 3.4 mg/dL (ref 2.8–4.1)
Potassium: 4.4 mmol/L (ref 3.5–5.2)
Sodium: 142 mmol/L (ref 134–144)

## 2019-05-01 DIAGNOSIS — R69 Illness, unspecified: Secondary | ICD-10-CM | POA: Diagnosis not present

## 2019-06-06 ENCOUNTER — Other Ambulatory Visit: Payer: Self-pay

## 2019-06-06 ENCOUNTER — Ambulatory Visit (INDEPENDENT_AMBULATORY_CARE_PROVIDER_SITE_OTHER): Payer: Medicare HMO

## 2019-06-06 DIAGNOSIS — Z23 Encounter for immunization: Secondary | ICD-10-CM | POA: Diagnosis not present

## 2019-06-07 DIAGNOSIS — H401131 Primary open-angle glaucoma, bilateral, mild stage: Secondary | ICD-10-CM | POA: Diagnosis not present

## 2019-06-14 ENCOUNTER — Ambulatory Visit: Payer: Medicare HMO

## 2019-07-19 ENCOUNTER — Other Ambulatory Visit: Payer: Self-pay

## 2019-07-19 ENCOUNTER — Ambulatory Visit (INDEPENDENT_AMBULATORY_CARE_PROVIDER_SITE_OTHER): Payer: Medicare HMO

## 2019-07-19 VITALS — BP 142/82 | HR 61 | Temp 97.5°F | Resp 16 | Ht 71.0 in | Wt 198.2 lb

## 2019-07-19 DIAGNOSIS — Z0000 Encounter for general adult medical examination without abnormal findings: Secondary | ICD-10-CM | POA: Diagnosis not present

## 2019-07-19 NOTE — Patient Instructions (Signed)
Miguel Adams , Thank you for taking time to come for your Medicare Wellness Visit. I appreciate your ongoing commitment to your health goals. Please review the following plan we discussed and let me know if I can assist you in the future.   Screening recommendations/referrals: Colonoscopy: done 04/01/17 Recommended yearly ophthalmology/optometry visit for glaucoma screening and checkup Recommended yearly dental visit for hygiene and checkup  Vaccinations: Influenza vaccine: done 06/06/19 Pneumococcal vaccine: done 05/20/18 Tdap vaccine: done 02/12/18 Shingles vaccine: Shingrix series completed 07/10/18  Advanced directives: Please bring a copy of your health care power of attorney and living will to the office at your convenience.  Conditions/risks identified: Keep up the great work!  Next appointment: Please follow up in one year for your Medicare Annual Wellness visit.    Preventive Care 42 Years and Older, Male Preventive care refers to lifestyle choices and visits with your health care provider that can promote health and wellness. What does preventive care include?  A yearly physical exam. This is also called an annual well check.  Dental exams once or twice a year.  Routine eye exams. Ask your health care provider how often you should have your eyes checked.  Personal lifestyle choices, including:  Daily care of your teeth and gums.  Regular physical activity.  Eating a healthy diet.  Avoiding tobacco and drug use.  Limiting alcohol use.  Practicing safe sex.  Taking low doses of aspirin every day.  Taking vitamin and mineral supplements as recommended by your health care provider. What happens during an annual well check? The services and screenings done by your health care provider during your annual well check will depend on your age, overall health, lifestyle risk factors, and family history of disease. Counseling  Your health care provider may ask you questions  about your:  Alcohol use.  Tobacco use.  Drug use.  Emotional well-being.  Home and relationship well-being.  Sexual activity.  Eating habits.  History of falls.  Memory and ability to understand (cognition).  Work and work Statistician. Screening  You may have the following tests or measurements:  Height, weight, and BMI.  Blood pressure.  Lipid and cholesterol levels. These may be checked every 5 years, or more frequently if you are over 78 years old.  Skin check.  Lung cancer screening. You may have this screening every year starting at age 29 if you have a 30-pack-year history of smoking and currently smoke or have quit within the past 15 years.  Fecal occult blood test (FOBT) of the stool. You may have this test every year starting at age 33.  Flexible sigmoidoscopy or colonoscopy. You may have a sigmoidoscopy every 5 years or a colonoscopy every 10 years starting at age 20.  Prostate cancer screening. Recommendations will vary depending on your family history and other risks.  Hepatitis C blood test.  Hepatitis B blood test.  Sexually transmitted disease (STD) testing.  Diabetes screening. This is done by checking your blood sugar (glucose) after you have not eaten for a while (fasting). You may have this done every 1-3 years.  Abdominal aortic aneurysm (AAA) screening. You may need this if you are a current or former smoker.  Osteoporosis. You may be screened starting at age 71 if you are at high risk. Talk with your health care provider about your test results, treatment options, and if necessary, the need for more tests. Vaccines  Your health care provider may recommend certain vaccines, such as:  Influenza vaccine.  This is recommended every year.  Tetanus, diphtheria, and acellular pertussis (Tdap, Td) vaccine. You may need a Td booster every 10 years.  Zoster vaccine. You may need this after age 37.  Pneumococcal 13-valent conjugate (PCV13)  vaccine. One dose is recommended after age 44.  Pneumococcal polysaccharide (PPSV23) vaccine. One dose is recommended after age 69. Talk to your health care provider about which screenings and vaccines you need and how often you need them. This information is not intended to replace advice given to you by your health care provider. Make sure you discuss any questions you have with your health care provider. Document Released: 09/27/2015 Document Revised: 05/20/2016 Document Reviewed: 07/02/2015 Elsevier Interactive Patient Education  2017 [place] Prevention in the Home Falls can cause injuries. They can happen to people of all ages. There are many things you can do to make your home safe and to help prevent falls. What can I do on the outside of my home?  Regularly fix the edges of walkways and driveways and fix any cracks.  Remove anything that might make you trip as you walk through a door, such as a raised step or threshold.  Trim any bushes or trees on the path to your home.  Use bright outdoor lighting.  Clear any walking paths of anything that might make someone trip, such as rocks or tools.  Regularly check to see if handrails are loose or broken. Make sure that both sides of any steps have handrails.  Any raised decks and porches should have guardrails on the edges.  Have any leaves, snow, or ice cleared regularly.  Use sand or salt on walking paths during winter.  Clean up any spills in your garage right away. This includes oil or grease spills. What can I do in the bathroom?  Use night lights.  Install grab bars by the toilet and in the tub and shower. Do not use towel bars as grab bars.  Use non-skid mats or decals in the tub or shower.  If you need to sit down in the shower, use a plastic, non-slip stool.  Keep the floor dry. Clean up any water that spills on the floor as soon as it happens.  Remove soap buildup in the tub or shower regularly.   Attach bath mats securely with double-sided non-slip rug tape.  Do not have throw rugs and other things on the floor that can make you trip. What can I do in the bedroom?  Use night lights.  Make sure that you have a light by your bed that is easy to reach.  Do not use any sheets or blankets that are too big for your bed. They should not hang down onto the floor.  Have a firm chair that has side arms. You can use this for support while you get dressed.  Do not have throw rugs and other things on the floor that can make you trip. What can I do in the kitchen?  Clean up any spills right away.  Avoid walking on wet floors.  Keep items that you use a lot in easy-to-reach places.  If you need to reach something above you, use a strong step stool that has a grab bar.  Keep electrical cords out of the way.  Do not use floor polish or wax that makes floors slippery. If you must use wax, use non-skid floor wax.  Do not have throw rugs and other things on the floor that can make  you trip. What can I do with my stairs?  Do not leave any items on the stairs.  Make sure that there are handrails on both sides of the stairs and use them. Fix handrails that are broken or loose. Make sure that handrails are as long as the stairways.  Check any carpeting to make sure that it is firmly attached to the stairs. Fix any carpet that is loose or worn.  Avoid having throw rugs at the top or bottom of the stairs. If you do have throw rugs, attach them to the floor with carpet tape.  Make sure that you have a light switch at the top of the stairs and the bottom of the stairs. If you do not have them, ask someone to add them for you. What else can I do to help prevent falls?  Wear shoes that:  Do not have high heels.  Have rubber bottoms.  Are comfortable and fit you well.  Are closed at the toe. Do not wear sandals.  If you use a stepladder:  Make sure that it is fully opened. Do not climb  a closed stepladder.  Make sure that both sides of the stepladder are locked into place.  Ask someone to hold it for you, if possible.  Clearly mark and make sure that you can see:  Any grab bars or handrails.  First and last steps.  Where the edge of each step is.  Use tools that help you move around (mobility aids) if they are needed. These include:  Canes.  Walkers.  Scooters.  Crutches.  Turn on the lights when you go into a dark area. Replace any light bulbs as soon as they burn out.  Set up your furniture so you have a clear path. Avoid moving your furniture around.  If any of your floors are uneven, fix them.  If there are any pets around you, be aware of where they are.  Review your medicines with your doctor. Some medicines can make you feel dizzy. This can increase your chance of falling. Ask your doctor what other things that you can do to help prevent falls. This information is not intended to replace advice given to you by your health care provider. Make sure you discuss any questions you have with your health care provider. Document Released: 06/27/2009 Document Revised: 02/06/2016 Document Reviewed: 10/05/2014 Elsevier Interactive Patient Education  2017 [employer].
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
## 2019-09-18 DIAGNOSIS — L570 Actinic keratosis: Secondary | ICD-10-CM | POA: Diagnosis not present

## 2019-09-18 DIAGNOSIS — L821 Other seborrheic keratosis: Secondary | ICD-10-CM | POA: Diagnosis not present

## 2019-09-18 DIAGNOSIS — L578 Other skin changes due to chronic exposure to nonionizing radiation: Secondary | ICD-10-CM | POA: Diagnosis not present

## 2019-09-18 DIAGNOSIS — L918 Other hypertrophic disorders of the skin: Secondary | ICD-10-CM | POA: Diagnosis not present

## 2019-09-18 DIAGNOSIS — L84 Corns and callosities: Secondary | ICD-10-CM | POA: Diagnosis not present

## 2019-09-18 DIAGNOSIS — D692 Other nonthrombocytopenic purpura: Secondary | ICD-10-CM | POA: Diagnosis not present

## 2019-11-15 DIAGNOSIS — R030 Elevated blood-pressure reading, without diagnosis of hypertension: Secondary | ICD-10-CM | POA: Diagnosis not present

## 2019-11-15 DIAGNOSIS — Z87891 Personal history of nicotine dependence: Secondary | ICD-10-CM | POA: Diagnosis not present

## 2019-11-15 DIAGNOSIS — E785 Hyperlipidemia, unspecified: Secondary | ICD-10-CM | POA: Diagnosis not present

## 2019-11-15 DIAGNOSIS — H409 Unspecified glaucoma: Secondary | ICD-10-CM | POA: Diagnosis not present

## 2019-11-15 DIAGNOSIS — Z7982 Long term (current) use of aspirin: Secondary | ICD-10-CM | POA: Diagnosis not present

## 2019-11-15 DIAGNOSIS — K219 Gastro-esophageal reflux disease without esophagitis: Secondary | ICD-10-CM | POA: Diagnosis not present

## 2019-11-19 ENCOUNTER — Other Ambulatory Visit: Payer: Self-pay | Admitting: Family Medicine

## 2019-11-19 DIAGNOSIS — K219 Gastro-esophageal reflux disease without esophagitis: Secondary | ICD-10-CM

## 2019-11-19 DIAGNOSIS — E785 Hyperlipidemia, unspecified: Secondary | ICD-10-CM

## 2019-11-28 ENCOUNTER — Encounter: Payer: Self-pay | Admitting: Family Medicine

## 2019-11-28 ENCOUNTER — Ambulatory Visit (INDEPENDENT_AMBULATORY_CARE_PROVIDER_SITE_OTHER): Payer: Medicare HMO | Admitting: Family Medicine

## 2019-11-28 ENCOUNTER — Other Ambulatory Visit: Payer: Self-pay

## 2019-11-28 DIAGNOSIS — R69 Illness, unspecified: Secondary | ICD-10-CM | POA: Diagnosis not present

## 2019-11-28 DIAGNOSIS — E785 Hyperlipidemia, unspecified: Secondary | ICD-10-CM

## 2019-11-28 DIAGNOSIS — K219 Gastro-esophageal reflux disease without esophagitis: Secondary | ICD-10-CM | POA: Diagnosis not present

## 2019-11-28 MED ORDER — SIMVASTATIN 20 MG PO TABS: 20.0000 mg | ORAL_TABLET | Freq: Every day | ORAL | 1 refills | Status: DC

## 2019-11-28 MED ORDER — PANTOPRAZOLE SODIUM 40 MG PO TBEC: 40.0000 mg | DELAYED_RELEASE_TABLET | Freq: Every day | ORAL | 1 refills | Status: DC
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
## 2019-12-06 DIAGNOSIS — H401131 Primary open-angle glaucoma, bilateral, mild stage: Secondary | ICD-10-CM | POA: Diagnosis not present

## 2019-12-25 IMAGING — MR MR KNEE*L* W/O CM
7 series · 40 of 40 positions shown · non-contrast
Comparison: None.

CLINICAL DATA: Left knee pain intermittently since June 2017,
especially laterally

EXAM:
MRI OF THE LEFT KNEE WITHOUT CONTRAST
TECHNIQUE: Multiplanar, multisequence MR imaging of the knee was performed. No
intravenous contrast was administered.

[Series 3: T2 fat-sat · axial · 4.0mm · 0.53mm/px · z∈[-105,+65]mm · 6 of 35 slices shown (1 of 3)]
[im 1/35]
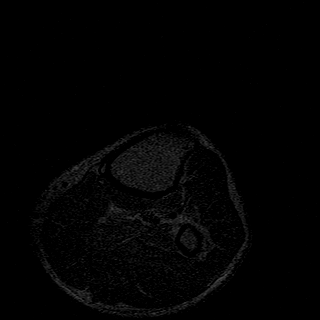
[im 7/35]
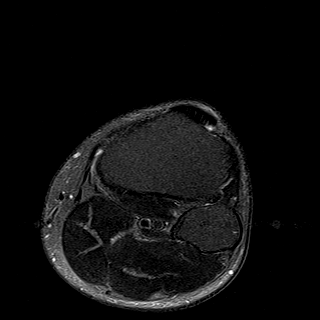
[im 14/35]
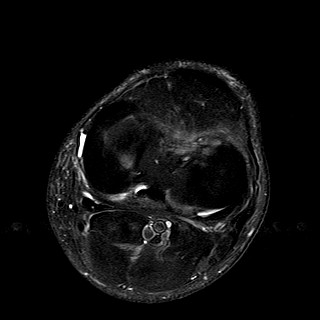
[im 21/35]
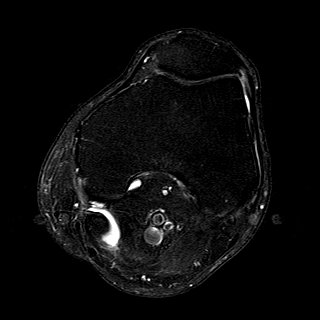
[im 28/35]
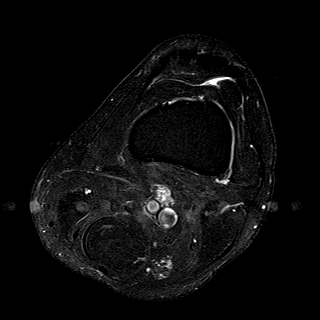
[im 35/35]
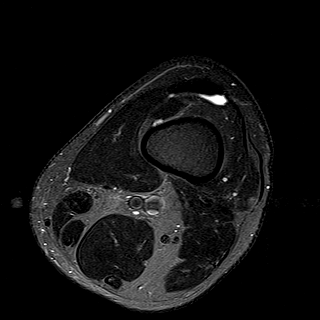

[Series 4: T1 · coronal · 4.0mm · 0.62mm/px · 6 of 31 slices shown]
[im 1/31]
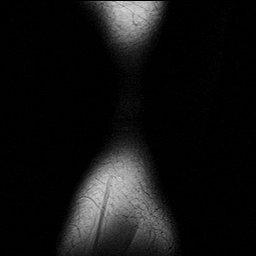
[im 7/31]
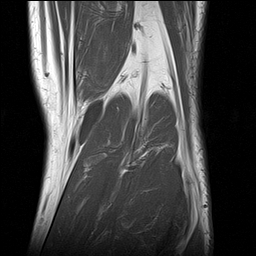
[im 13/31]
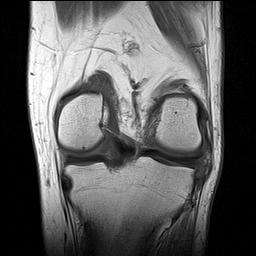
[im 19/31]
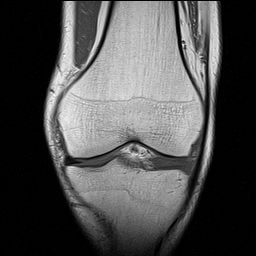
[im 25/31]
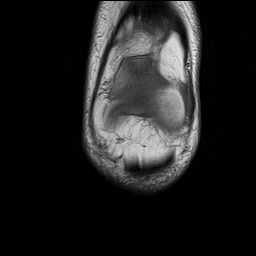
[im 31/31]
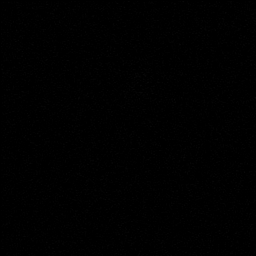

[Series 5: T2 fat-sat · coronal · 4.0mm · 0.62mm/px · 6 of 31 slices shown (2 of 3)]
[im 1/31]
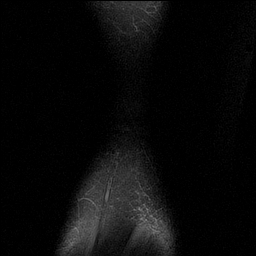
[im 7/31]
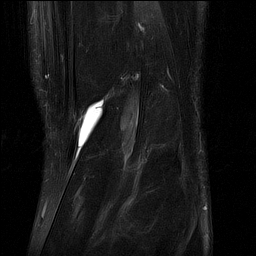
[im 13/31]
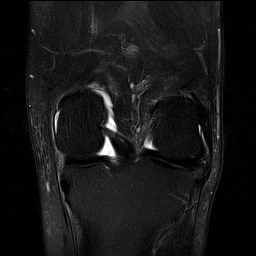
[im 19/31]
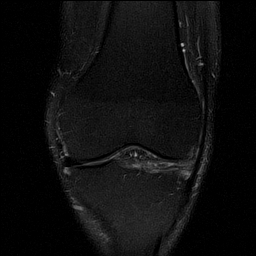
[im 25/31]
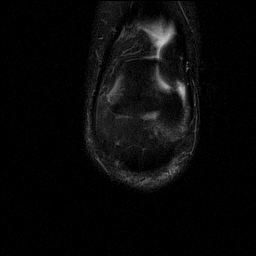
[im 31/31]
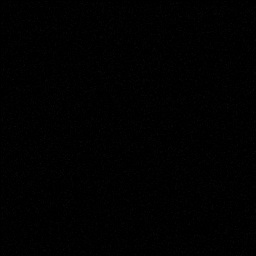

[Series 6: PD fat-sat · coronal · 4.0mm · 0.62mm/px · 6 of 31 slices shown (1 of 3)]
[im 1/31]
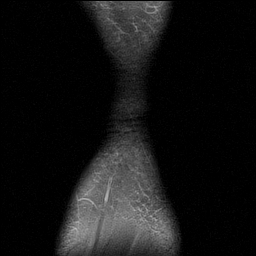
[im 7/31]
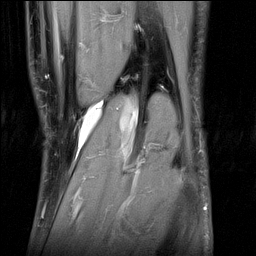
[im 13/31]
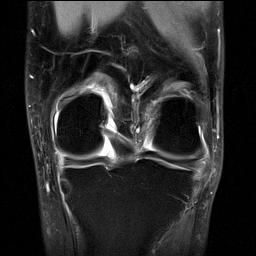
[im 19/31]
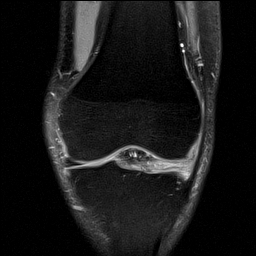
[im 25/31]
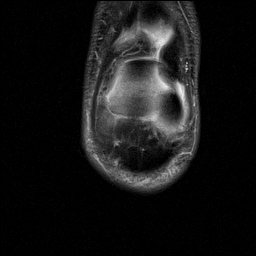
[im 31/31]
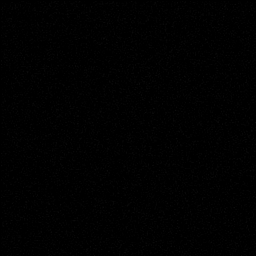

[Series 7: PD fat-sat · sagittal · 3.0mm · 0.62mm/px · 6 of 31 slices shown (2 of 3)]
[im 1/31]
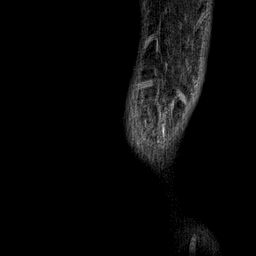
[im 7/31]
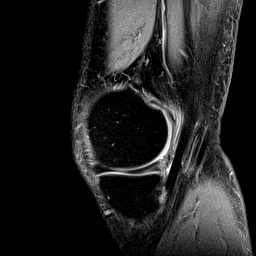
[im 13/31]
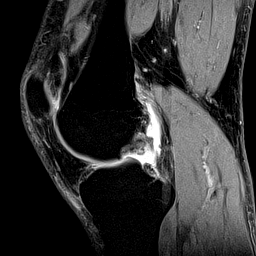
[im 19/31]
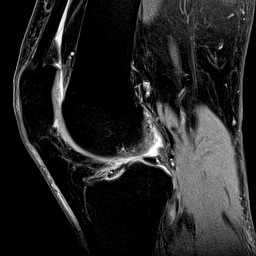
[im 25/31]
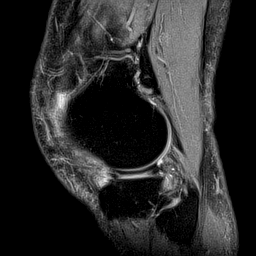
[im 31/31]
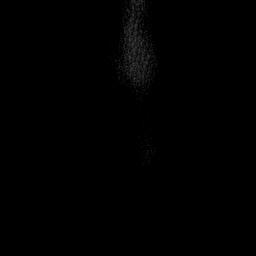

[Series 8: T2 fat-sat · sagittal · 3.0mm · 0.62mm/px · 6 of 31 slices shown (3 of 3)]
[im 1/31]
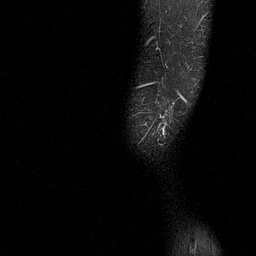
[im 7/31]
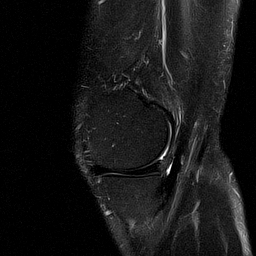
[im 13/31]
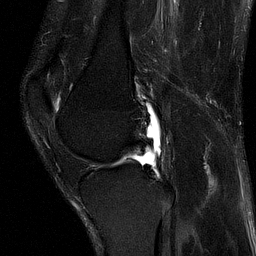
[im 19/31]
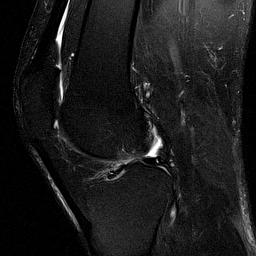
[im 25/31]
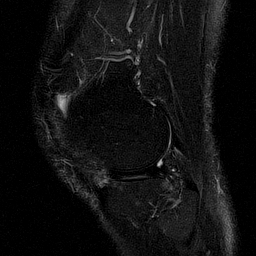
[im 31/31]
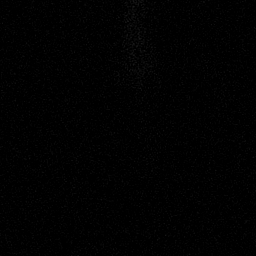

[Series 9: PD fat-sat · coronal · 2.0mm · 0.62mm/px · 4 of 19 slices shown (3 of 3)]
[im 1/19]
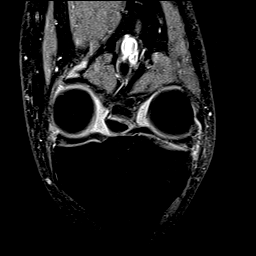
[im 7/19]
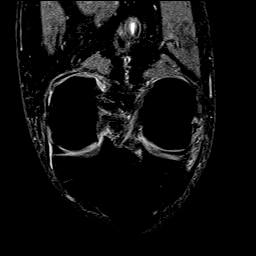
[im 13/19]
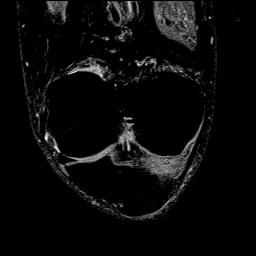
[im 19/19]
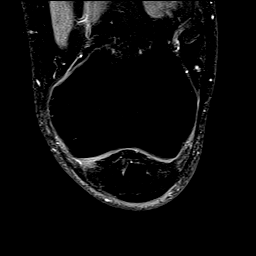

[40 of 40 positions shown; findings below may reference images not displayed]

FINDINGS: MENISCI

Medial meniscus: Suspected small grade 3 oblique tear of the
posterior horn medial meniscus for example on images 1 through 2 of
series 9 and faintly appreciable on images 7-8 of series 7.

Lateral meniscus: Grade 3 oblique tear of the anterior horn lateral
meniscus extending to the inferior surface and the periphery, where
there is a 0.5 by 0.5 by 1.0 cm para meniscal cyst for example on
image [DATE]. The tear extends along the inferior meniscal surface in
the midbody.

LIGAMENTS

Cruciates:  Unremarkable

Collaterals:  Mild MCL bursitis

CARTILAGE

Patellofemoral: Mild chondral fissuring along the posterior patellar
ridge for example on image [DATE].

Medial:  Unremarkable

Lateral:  Unremarkable

Joint:  Small knee effusion.

Popliteal Fossa:  Small Baker's cyst.

Extensor Mechanism:  Unremarkable

Bones: No significant extra-articular osseous abnormalities
identified.

Other: No supplemental non-categorized findings.
IMPRESSION: 1. Grade 3 oblique tear of the anterior horn lateral meniscus
involving the inferior meniscal surface. There is a small associated
peripheral parameniscal cyst.
2. Suspected small grade 3 oblique tear of the posterior horn medial
meniscus involving the inferior surface.
3. Mild MCL bursitis.
4. Small knee effusion and small Baker's cyst.
5. Mild chondral fissuring along the posterior patellar ridge.

## 2020-04-02 ENCOUNTER — Encounter: Payer: Medicare HMO | Admitting: Family Medicine

## 2020-04-09 ENCOUNTER — Encounter: Payer: Medicare HMO | Admitting: Family Medicine

## 2020-04-10 ENCOUNTER — Ambulatory Visit (INDEPENDENT_AMBULATORY_CARE_PROVIDER_SITE_OTHER): Payer: Medicare HMO | Admitting: Family Medicine

## 2020-04-10 ENCOUNTER — Other Ambulatory Visit: Payer: Self-pay

## 2020-04-10 ENCOUNTER — Encounter: Payer: Self-pay | Admitting: Family Medicine

## 2020-04-10 VITALS — BP 130/70 | HR 60 | Ht 71.0 in | Wt 198.0 lb

## 2020-04-10 DIAGNOSIS — Z0000 Encounter for general adult medical examination without abnormal findings: Secondary | ICD-10-CM

## 2020-04-10 DIAGNOSIS — R351 Nocturia: Secondary | ICD-10-CM

## 2020-04-10 DIAGNOSIS — E7800 Pure hypercholesterolemia, unspecified: Secondary | ICD-10-CM | POA: Diagnosis not present

## 2020-04-10 LAB — HEMOCCULT GUIAC POC 1CARD (OFFICE): Fecal Occult Blood, POC: NEGATIVE
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
## 2020-04-11 LAB — PSA: Prostate Specific Ag, Serum: 0.4 ng/mL (ref 0.0–4.0)

## 2020-04-11 LAB — RENAL FUNCTION PANEL
Albumin: 4.8 g/dL — ABNORMAL HIGH (ref 3.7–4.7)
BUN/Creatinine Ratio: 18 (ref 10–24)
BUN: 18 mg/dL (ref 8–27)
CO2: 23 mmol/L (ref 20–29)
Calcium: 9.2 mg/dL (ref 8.6–10.2)
Chloride: 103 mmol/L (ref 96–106)
Creatinine, Ser: 1 mg/dL (ref 0.76–1.27)
GFR calc Af Amer: 87 mL/min/{1.73_m2}
GFR calc non Af Amer: 75 mL/min/{1.73_m2}
Glucose: 115 mg/dL — ABNORMAL HIGH (ref 65–99)
Phosphorus: 2.7 mg/dL — ABNORMAL LOW (ref 2.8–4.1)
Potassium: 4.2 mmol/L (ref 3.5–5.2)
Sodium: 141 mmol/L (ref 134–144)

## 2020-04-11 LAB — LIPID PANEL
Chol/HDL Ratio: 4.2 ratio (ref 0.0–5.0)
Cholesterol, Total: 166 mg/dL (ref 100–199)
HDL: 40 mg/dL
LDL Chol Calc (NIH): 103 mg/dL — ABNORMAL HIGH (ref 0–99)
Triglycerides: 130 mg/dL (ref 0–149)
VLDL Cholesterol Cal: 23 mg/dL (ref 5–40)

## 2020-05-23 DIAGNOSIS — R69 Illness, unspecified: Secondary | ICD-10-CM | POA: Diagnosis not present

## 2020-06-06 ENCOUNTER — Other Ambulatory Visit: Payer: Self-pay | Admitting: Family Medicine

## 2020-06-06 DIAGNOSIS — K219 Gastro-esophageal reflux disease without esophagitis: Secondary | ICD-10-CM

## 2020-06-08 ENCOUNTER — Other Ambulatory Visit: Payer: Self-pay | Admitting: Family Medicine

## 2020-06-08 DIAGNOSIS — E785 Hyperlipidemia, unspecified: Secondary | ICD-10-CM

## 2020-06-08 NOTE — Telephone Encounter (Signed)
Requested Prescriptions  Pending Prescriptions Disp Refills   simvastatin (ZOCOR) 20 MG tablet [Pharmacy Med Name: SIMVASTATIN 20 MG TABLET] 90 tablet 3    Sig: TAKE 1 TABLET BY MOUTH EVERY DAY     Cardiovascular:  Antilipid - Statins Failed - 06/08/2020 10:00 AM      Failed - LDL in normal range and within 360 days    LDL Chol Calc (NIH)  Date Value Ref Range Status  04/10/2020 103 (H) 0 - 99 mg/dL Final         Passed - Total Cholesterol in normal range and within 360 days    Cholesterol, Total  Date Value Ref Range Status  04/10/2020 166 100 - 199 mg/dL Final         Passed - HDL in normal range and within 360 days    HDL  Date Value Ref Range Status  04/10/2020 40 >39 mg/dL Final         Passed - Triglycerides in normal range and within 360 days    Triglycerides  Date Value Ref Range Status  04/10/2020 130 0 - 149 mg/dL Final         Passed - Patient is not pregnant      Passed - Valid encounter within last 12 months    Recent Outpatient Visits          1 month ago Annual physical exam   [place], Deanna C, MD   6 months ago Gastroesophageal reflux disease   [place], Deanna C, MD   1 year ago Annual physical exam   [facility] Juline Patch, MD   2 years ago Acute lateral meniscal injury of left knee, sequela   [facility] Juline Patch, MD   2 years ago Annual physical exam   [place], Deanna C, MD      Future Appointments            In 3 months Juline Patch, MD [facility], [place]   In 10 months Juline Patch, MD [facility], [facility]

## 2020-06-11 DIAGNOSIS — H401131 Primary open-angle glaucoma, bilateral, mild stage: Secondary | ICD-10-CM | POA: Diagnosis not present

## 2020-07-01 DIAGNOSIS — R69 Illness, unspecified: Secondary | ICD-10-CM | POA: Diagnosis not present

## 2020-07-22 ENCOUNTER — Ambulatory Visit: Payer: Medicare HMO

## 2020-07-29 ENCOUNTER — Ambulatory Visit (INDEPENDENT_AMBULATORY_CARE_PROVIDER_SITE_OTHER): Payer: Medicare HMO

## 2020-07-29 ENCOUNTER — Other Ambulatory Visit: Payer: Self-pay

## 2020-07-29 VITALS — BP 140/80 | HR 62 | Temp 97.8°F | Resp 16 | Ht 71.0 in | Wt 207.8 lb

## 2020-07-29 DIAGNOSIS — Z0000 Encounter for general adult medical examination without abnormal findings: Secondary | ICD-10-CM
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
## 2020-07-29 NOTE — Patient Instructions (Signed)
Miguel Adams , Thank you for taking time to come for your Medicare Wellness Visit. I appreciate your ongoing commitment to your health goals. Please review the following plan we discussed and let me know if I can assist you in the future.   Screening recommendations/referrals: Colonoscopy: done 04/01/17 Recommended yearly ophthalmology/optometry visit for glaucoma screening and checkup Recommended yearly dental visit for hygiene and checkup  Vaccinations: Influenza vaccine: done 05/23/20 Pneumococcal vaccine: done 05/19/18 Tdap vaccine: done 03/07/18 Shingles vaccine: done 03/14/18 & 07/10/18   Covid-19: done 10/30/19, 11/20/19 & 07/11/20  Advanced directives: Please bring a copy of your health care power of attorney and living will to the office at your convenience.  Conditions/risks identified: Keep up the great work!  Next appointment: Follow up in one year for your annual wellness visit.   Preventive Care 72 Years and Older, Male Preventive care refers to lifestyle choices and visits with your health care provider that can promote health and wellness. What does preventive care include?  A yearly physical exam. This is also called an annual well check.  Dental exams once or twice a year.  Routine eye exams. Ask your health care provider how often you should have your eyes checked.  Personal lifestyle choices, including:  Daily care of your teeth and gums.  Regular physical activity.  Eating a healthy diet.  Avoiding tobacco and drug use.  Limiting alcohol use.  Practicing safe sex.  Taking low doses of aspirin every day.  Taking vitamin and mineral supplements as recommended by your health care provider. What happens during an annual well check? The services and screenings done by your health care provider during your annual well check will depend on your age, overall health, lifestyle risk factors, and family history of disease. Counseling  Your health care provider may ask  you questions about your:  Alcohol use.  Tobacco use.  Drug use.  Emotional well-being.  Home and relationship well-being.  Sexual activity.  Eating habits.  History of falls.  Memory and ability to understand (cognition).  Work and work Statistician. Screening  You may have the following tests or measurements:  Height, weight, and BMI.  Blood pressure.  Lipid and cholesterol levels. These may be checked every 5 years, or more frequently if you are over 64 years old.  Skin check.  Lung cancer screening. You may have this screening every year starting at age 70 if you have a 30-pack-year history of smoking and currently smoke or have quit within the past 15 years.  Fecal occult blood test (FOBT) of the stool. You may have this test every year starting at age 23.  Flexible sigmoidoscopy or colonoscopy. You may have a sigmoidoscopy every 5 years or a colonoscopy every 10 years starting at age 10.  Prostate cancer screening. Recommendations will vary depending on your family history and other risks.  Hepatitis C blood test.  Hepatitis B blood test.  Sexually transmitted disease (STD) testing.  Diabetes screening. This is done by checking your blood sugar (glucose) after you have not eaten for a while (fasting). You may have this done every 1-3 years.  Abdominal aortic aneurysm (AAA) screening. You may need this if you are a current or former smoker.  Osteoporosis. You may be screened starting at age 98 if you are at high risk. Talk with your health care provider about your test results, treatment options, and if necessary, the need for more tests. Vaccines  Your health care provider may recommend certain vaccines,  such as:  Influenza vaccine. This is recommended every year.  Tetanus, diphtheria, and acellular pertussis (Tdap, Td) vaccine. You may need a Td booster every 10 years.  Zoster vaccine. You may need this after age 33.  Pneumococcal 13-valent conjugate  (PCV13) vaccine. One dose is recommended after age 21.  Pneumococcal polysaccharide (PPSV23) vaccine. One dose is recommended after age 107. Talk to your health care provider about which screenings and vaccines you need and how often you need them. This information is not intended to replace advice given to you by your health care provider. Make sure you discuss any questions you have with your health care provider. Document Released: 09/27/2015 Document Revised: 05/20/2016 Document Reviewed: 07/02/2015 Elsevier Interactive Patient Education  2017 [place] Prevention in the Home Falls can cause injuries. They can happen to people of all ages. There are many things you can do to make your home safe and to help prevent falls. What can I do on the outside of my home?  Regularly fix the edges of walkways and driveways and fix any cracks.  Remove anything that might make you trip as you walk through a door, such as a raised step or threshold.  Trim any bushes or trees on the path to your home.  Use bright outdoor lighting.  Clear any walking paths of anything that might make someone trip, such as rocks or tools.  Regularly check to see if handrails are loose or broken. Make sure that both sides of any steps have handrails.  Any raised decks and porches should have guardrails on the edges.  Have any leaves, snow, or ice cleared regularly.  Use sand or salt on walking paths during winter.  Clean up any spills in your garage right away. This includes oil or grease spills. What can I do in the bathroom?  Use night lights.  Install grab bars by the toilet and in the tub and shower. Do not use towel bars as grab bars.  Use non-skid mats or decals in the tub or shower.  If you need to sit down in the shower, use a plastic, non-slip stool.  Keep the floor dry. Clean up any water that spills on the floor as soon as it happens.  Remove soap buildup in the tub or shower  regularly.  Attach bath mats securely with double-sided non-slip rug tape.  Do not have throw rugs and other things on the floor that can make you trip. What can I do in the bedroom?  Use night lights.  Make sure that you have a light by your bed that is easy to reach.  Do not use any sheets or blankets that are too big for your bed. They should not hang down onto the floor.  Have a firm chair that has side arms. You can use this for support while you get dressed.  Do not have throw rugs and other things on the floor that can make you trip. What can I do in the kitchen?  Clean up any spills right away.  Avoid walking on wet floors.  Keep items that you use a lot in easy-to-reach places.  If you need to reach something above you, use a strong step stool that has a grab bar.  Keep electrical cords out of the way.  Do not use floor polish or wax that makes floors slippery. If you must use wax, use non-skid floor wax.  Do not have throw rugs and other things on  the floor that can make you trip. What can I do with my stairs?  Do not leave any items on the stairs.  Make sure that there are handrails on both sides of the stairs and use them. Fix handrails that are broken or loose. Make sure that handrails are as long as the stairways.  Check any carpeting to make sure that it is firmly attached to the stairs. Fix any carpet that is loose or worn.  Avoid having throw rugs at the top or bottom of the stairs. If you do have throw rugs, attach them to the floor with carpet tape.  Make sure that you have a light switch at the top of the stairs and the bottom of the stairs. If you do not have them, ask someone to add them for you. What else can I do to help prevent falls?  Wear shoes that:  Do not have high heels.  Have rubber bottoms.  Are comfortable and fit you well.  Are closed at the toe. Do not wear sandals.  If you use a stepladder:  Make sure that it is fully  opened. Do not climb a closed stepladder.  Make sure that both sides of the stepladder are locked into place.  Ask someone to hold it for you, if possible.  Clearly mark and make sure that you can see:  Any grab bars or handrails.  First and last steps.  Where the edge of each step is.  Use tools that help you move around (mobility aids) if they are needed. These include:  Canes.  Walkers.  Scooters.  Crutches.  Turn on the lights when you go into a dark area. Replace any light bulbs as soon as they burn out.  Set up your furniture so you have a clear path. Avoid moving your furniture around.  If any of your floors are uneven, fix them.  If there are any pets around you, be aware of where they are.  Review your medicines with your doctor. Some medicines can make you feel dizzy. This can increase your chance of falling. Ask your doctor what other things that you can do to help prevent falls. This information is not intended to replace advice given to you by your health care provider. Make sure you discuss any questions you have with your health care provider. Document Released: 06/27/2009 Document Revised: 02/06/2016 Document Reviewed: 10/05/2014 Elsevier Interactive Patient Education  2017 [employer].

## 2020-09-23 ENCOUNTER — Ambulatory Visit (INDEPENDENT_AMBULATORY_CARE_PROVIDER_SITE_OTHER): Payer: Medicare HMO | Admitting: Family Medicine

## 2020-09-23 ENCOUNTER — Other Ambulatory Visit: Payer: Self-pay

## 2020-09-23 ENCOUNTER — Encounter: Payer: Self-pay | Admitting: Family Medicine

## 2020-09-23 VITALS — BP 138/82 | HR 72 | Ht 71.0 in | Wt 206.0 lb

## 2020-09-23 DIAGNOSIS — E785 Hyperlipidemia, unspecified: Secondary | ICD-10-CM | POA: Diagnosis not present

## 2020-09-23 DIAGNOSIS — K219 Gastro-esophageal reflux disease without esophagitis: Secondary | ICD-10-CM

## 2020-09-23 MED ORDER — PANTOPRAZOLE SODIUM 40 MG PO TBEC: 40.0000 mg | DELAYED_RELEASE_TABLET | Freq: Every day | ORAL | 1 refills | Status: DC

## 2020-09-23 MED ORDER — SIMVASTATIN 20 MG PO TABS: 20.0000 mg | ORAL_TABLET | Freq: Every day | ORAL | 1 refills | Status: DC
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
## 2020-12-10 DIAGNOSIS — H401131 Primary open-angle glaucoma, bilateral, mild stage: Secondary | ICD-10-CM | POA: Diagnosis not present

## 2021-04-11 ENCOUNTER — Other Ambulatory Visit: Payer: Self-pay

## 2021-04-11 ENCOUNTER — Ambulatory Visit (INDEPENDENT_AMBULATORY_CARE_PROVIDER_SITE_OTHER): Payer: Medicare HMO | Admitting: Family Medicine

## 2021-04-11 ENCOUNTER — Encounter: Payer: Self-pay | Admitting: Family Medicine

## 2021-04-11 VITALS — BP 122/80 | HR 60 | Ht 71.0 in | Wt 197.0 lb

## 2021-04-11 DIAGNOSIS — Z0000 Encounter for general adult medical examination without abnormal findings: Secondary | ICD-10-CM | POA: Diagnosis not present

## 2021-04-11 DIAGNOSIS — E785 Hyperlipidemia, unspecified: Secondary | ICD-10-CM | POA: Diagnosis not present

## 2021-04-11 DIAGNOSIS — R351 Nocturia: Secondary | ICD-10-CM | POA: Diagnosis not present

## 2021-04-11 LAB — POCT URINALYSIS DIPSTICK
Bilirubin, UA: NEGATIVE
Blood, UA: NEGATIVE
Glucose, UA: NEGATIVE
Ketones, UA: NEGATIVE
Leukocytes, UA: NEGATIVE
Nitrite, UA: NEGATIVE
Protein, UA: NEGATIVE
Spec Grav, UA: 1.015
Urobilinogen, UA: 0.2 U/dL
pH, UA: 5

## 2021-04-11 LAB — HEMOCCULT GUIAC POC 1CARD (OFFICE): Fecal Occult Blood, POC: NEGATIVE
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
## 2021-04-12 LAB — RENAL FUNCTION PANEL
Albumin: 4.9 g/dL — ABNORMAL HIGH (ref 3.7–4.7)
BUN/Creatinine Ratio: 15 (ref 10–24)
BUN: 17 mg/dL (ref 8–27)
CO2: 22 mmol/L (ref 20–29)
Calcium: 9.8 mg/dL (ref 8.6–10.2)
Chloride: 101 mmol/L (ref 96–106)
Creatinine, Ser: 1.13 mg/dL (ref 0.76–1.27)
Glucose: 115 mg/dL — ABNORMAL HIGH (ref 65–99)
Phosphorus: 3.3 mg/dL (ref 2.8–4.1)
Potassium: 4.4 mmol/L (ref 3.5–5.2)
Sodium: 139 mmol/L (ref 134–144)
eGFR: 69 mL/min/{1.73_m2}

## 2021-04-12 LAB — PSA: Prostate Specific Ag, Serum: 0.8 ng/mL (ref 0.0–4.0)

## 2021-04-12 LAB — LIPID PANEL WITH LDL/HDL RATIO
Cholesterol, Total: 142 mg/dL (ref 100–199)
HDL: 36 mg/dL — ABNORMAL LOW
LDL Chol Calc (NIH): 82 mg/dL (ref 0–99)
LDL/HDL Ratio: 2.3 ratio (ref 0.0–3.6)
Triglycerides: 135 mg/dL (ref 0–149)
VLDL Cholesterol Cal: 24 mg/dL (ref 5–40)

## 2021-06-05 ENCOUNTER — Other Ambulatory Visit: Payer: Self-pay | Admitting: Family Medicine

## 2021-06-05 DIAGNOSIS — E785 Hyperlipidemia, unspecified: Secondary | ICD-10-CM

## 2021-06-09 ENCOUNTER — Other Ambulatory Visit: Payer: Self-pay | Admitting: Family Medicine

## 2021-06-09 DIAGNOSIS — Z0100 Encounter for examination of eyes and vision without abnormal findings: Secondary | ICD-10-CM | POA: Diagnosis not present

## 2021-06-09 DIAGNOSIS — K219 Gastro-esophageal reflux disease without esophagitis: Secondary | ICD-10-CM

## 2021-06-09 DIAGNOSIS — H401131 Primary open-angle glaucoma, bilateral, mild stage: Secondary | ICD-10-CM | POA: Diagnosis not present

## 2021-06-30 ENCOUNTER — Other Ambulatory Visit: Payer: Self-pay | Admitting: Family Medicine

## 2021-06-30 DIAGNOSIS — K219 Gastro-esophageal reflux disease without esophagitis: Secondary | ICD-10-CM

## 2021-06-30 NOTE — Telephone Encounter (Signed)
Requested Prescriptions  Pending Prescriptions Disp Refills  . pantoprazole (PROTONIX) 40 MG tablet [Pharmacy Med Name: PANTOPRAZOLE SOD DR 40 MG TAB] 90 tablet 0    Sig: TAKE 1 TABLET BY MOUTH EVERY DAY     Gastroenterology: Proton Pump Inhibitors Passed - 06/30/2021  3:15 PM      Passed - Valid encounter within last 12 months    Recent Outpatient Visits          2 months ago Annual physical exam   [facility] Juline Patch, MD   9 months ago Hyperlipidemia, unspecified hyperlipidemia type   [facility] Juline Patch, MD   1 year ago Annual physical exam   [facility] Juline Patch, MD   1 year ago Gastroesophageal reflux disease   [facility] Juline Patch, MD   2 years ago Annual physical exam   [facility] Juline Patch, MD      Future Appointments            In 9 months Juline Patch, MD [facility], [facility]

## 2021-07-15 ENCOUNTER — Ambulatory Visit (INDEPENDENT_AMBULATORY_CARE_PROVIDER_SITE_OTHER): Payer: Medicare HMO | Admitting: Family Medicine

## 2021-07-15 ENCOUNTER — Encounter: Payer: Self-pay | Admitting: Family Medicine

## 2021-07-15 ENCOUNTER — Other Ambulatory Visit: Payer: Self-pay

## 2021-07-15 VITALS — BP 138/80 | HR 76 | Ht 71.0 in | Wt 202.0 lb

## 2021-07-15 DIAGNOSIS — J0130 Acute sphenoidal sinusitis, unspecified: Secondary | ICD-10-CM

## 2021-07-15 DIAGNOSIS — H9311 Tinnitus, right ear: Secondary | ICD-10-CM

## 2021-07-15 DIAGNOSIS — H6981 Other specified disorders of Eustachian tube, right ear: Secondary | ICD-10-CM | POA: Diagnosis not present

## 2021-07-15 DIAGNOSIS — H6991 Unspecified Eustachian tube disorder, right ear: Secondary | ICD-10-CM

## 2021-07-15 MED ORDER — MONTELUKAST SODIUM 10 MG PO TABS: 10.0000 mg | ORAL_TABLET | Freq: Every day | ORAL | 3 refills | Status: DC

## 2021-07-15 MED ORDER — PREDNISONE 10 MG PO TABS: 10.0000 mg | ORAL_TABLET | Freq: Every day | ORAL | 0 refills | Status: DC

## 2021-07-15 MED ORDER — FLUTICASONE PROPIONATE 50 MCG/ACT NA SUSP: 2.0000 | Freq: Every day | NASAL | 6 refills | Status: DC

## 2021-07-15 MED ORDER — AMOXICILLIN 500 MG PO CAPS: 500.0000 mg | ORAL_CAPSULE | Freq: Three times a day (TID) | ORAL | 0 refills | Status: AC
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
## 2021-07-15 NOTE — Patient Instructions (Signed)
Tinnitus Tinnitus refers to hearing a sound when there is no actual source for that sound. This is often described as ringing in the ears. However, people with this condition may hear a variety of noises, in one ear or in both ears. The sounds of tinnitus can be soft, loud, or somewhere in between. Tinnitus can last for a few seconds or can be constant for days. It may go away without treatment and come back at various times. When tinnitus is constant or happens often, it can lead to other problems, such as trouble sleeping and trouble concentrating. Almost everyone experiences tinnitus at some point. Tinnitus is not the same as hearing loss. Tinnitus that is long-lasting (chronic) or comes back often (recurs) may require medical attention. What are the causes? The cause of tinnitus is often not known. In some cases, it can result from: Exposure to loud noises from machinery, music, or other sources. An object (foreign body) stuck in the ear. Earwax buildup. Drinking alcohol or caffeine. Taking certain medicines. Age-related hearing loss. It may also be caused by medical conditions such as: Ear or sinus infections. Heart diseases or high blood pressure. Allergies. Mnire's disease. Thyroid problems. Tumors. A weak, bulging blood vessel (aneurysm) near the ear. What increases the risk? The following factors may make you more likely to develop this condition: Exposure to loud noises. Age. Tinnitus is more likely in older individuals. Using alcohol or tobacco. What are the signs or symptoms? The main symptom of tinnitus is hearing a sound when there is no source for that sound. It may sound like: Buzzing. Sizzling. Ringing. Blowing air. Hissing. Whistling. Other sounds may include: Roaring. Running water. A musical note. Tapping. Humming. Symptoms may affect only one ear (unilateral) or both ears (bilateral). How is this diagnosed? Tinnitus is diagnosed based on your symptoms,  your medical history, and a physical exam. Your health care provider may do a thorough hearing test (audiologic exam) if your tinnitus: Is unilateral. Causes hearing difficulties. Lasts 6 months or longer. You may work with a health care provider who specializes in hearing disorders (audiologist). You may be asked questions about your symptoms and how they affect your daily life. You may have other tests done, such as: CT scan. MRI. An imaging test of how blood flows through your blood vessels (angiogram). How is this treated? Treating an underlying medical condition can sometimes make tinnitus go away. If your tinnitus continues, other treatments may include: Therapy and counseling to help you manage the stress of living with tinnitus. Sound generators to mask the tinnitus. These include: Tabletop sound machines that play relaxing sounds to help you fall asleep. Wearable devices that fit in your ear and play sounds or music. Acoustic neural stimulation. This involves using headphones to listen to music that contains an auditory signal. Over time, listening to this signal may change some pathways in your brain and make you less sensitive to tinnitus. This treatment is used for very severe cases when no other treatment is working. Using hearing aids or cochlear implants if your tinnitus is related to hearing loss. Hearing aids are worn in the outer ear. Cochlear implants are surgically placed in the inner ear. Follow these instructions at home: Managing symptoms   When possible, avoid being in loud places and being exposed to loud sounds. Wear hearing protection, such as earplugs, when you are exposed to loud noises. Use a white noise machine, a humidifier, or other devices to mask the sound of tinnitus. Practice techniques  for reducing stress, such as meditation, yoga, or deep breathing. Work with your health care provider if you need help with managing stress. Sleep with your head slightly  raised. This may reduce the impact of tinnitus. General instructions Do not use stimulants, such as nicotine, alcohol, or caffeine. Talk with your health care provider about other stimulants to avoid. Stimulants are substances that can make you feel alert and attentive by increasing certain activities in the body (such as heart rate and blood pressure). These substances may make tinnitus worse. Take over-the-counter and prescription medicines only as told by your health care provider. Try to get plenty of sleep each night. Keep all follow-up visits. This is important. Contact a health care provider if: Your tinnitus continues for 3 weeks or longer without stopping. You develop sudden hearing loss. Your symptoms get worse or do not get better with home care. You feel you are not able to manage the stress of living with tinnitus. Get help right away if: You develop tinnitus after a head injury. You have tinnitus along with any of the following: Dizziness. Nausea and vomiting. Loss of balance. Sudden, severe headache. Vision changes. Facial weakness or weakness of arms or legs. These symptoms may represent a serious problem that is an emergency. Do not wait to see if the symptoms will go away. Get medical help right away. Call your local emergency services (911 in the [place] Do not drive yourself to the hospital. Summary Tinnitus refers to hearing a sound when there is no actual source for that sound. This is often described as ringing in the ears. Symptoms may affect only one ear (unilateral) or both ears (bilateral). Use a white noise machine, a humidifier, or other devices to mask the sound of tinnitus. Do not use stimulants, such as nicotine, alcohol, or caffeine. These substances may make tinnitus worse. This information is not intended to replace advice given to you by your health care provider. Make sure you discuss any questions you have with your health care provider. Document  Revised: 08/05/2020 Document Reviewed: 08/05/2020 Elsevier Patient Education  2022 [employer].

## 2021-07-30 ENCOUNTER — Ambulatory Visit (INDEPENDENT_AMBULATORY_CARE_PROVIDER_SITE_OTHER): Payer: Medicare HMO | Admitting: Family Medicine

## 2021-07-30 ENCOUNTER — Other Ambulatory Visit: Payer: Self-pay

## 2021-07-30 ENCOUNTER — Encounter: Payer: Self-pay | Admitting: Family Medicine

## 2021-07-30 ENCOUNTER — Ambulatory Visit (INDEPENDENT_AMBULATORY_CARE_PROVIDER_SITE_OTHER): Payer: Medicare HMO

## 2021-07-30 VITALS — BP 138/76 | HR 59 | Temp 98.1°F | Ht 71.0 in | Wt 202.8 lb

## 2021-07-30 VITALS — BP 138/76 | HR 59 | Temp 98.1°F | Resp 16 | Ht 71.0 in | Wt 202.8 lb

## 2021-07-30 DIAGNOSIS — H9311 Tinnitus, right ear: Secondary | ICD-10-CM | POA: Diagnosis not present

## 2021-07-30 DIAGNOSIS — Z0000 Encounter for general adult medical examination without abnormal findings: Secondary | ICD-10-CM | POA: Diagnosis not present
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
## 2021-07-30 NOTE — Patient Instructions (Signed)
Miguel Adams , Thank you for taking time to come for your Medicare Wellness Visit. I appreciate your ongoing commitment to your health goals. Please review the following plan we discussed and let me know if I can assist you in the future.   Screening recommendations/referrals: Colonoscopy: done 04/01/17. Repeat 03/2027 Recommended yearly ophthalmology/optometry visit for glaucoma screening and checkup Recommended yearly dental visit for hygiene and checkup  Vaccinations: Influenza vaccine: done 06/11/21 Pneumococcal vaccine: done 05/19/18 Tdap vaccine: done 03/07/18 Shingles vaccine: done 03/14/18 & 07/10/18   Covid-19: done 10/30/19, 11/20/19, 06/11/20, 12/19/20 & 06/11/21  Advanced directives: Please bring a copy of your health care power of attorney and living will to the office at your convenience.   Conditions/risks identified: Keep up the great work!  Next appointment: Follow up in one year for your annual wellness visit.   Preventive Care 73 Years and Older, Male Preventive care refers to lifestyle choices and visits with your health care provider that can promote health and wellness. What does preventive care include? A yearly physical exam. This is also called an annual well check. Dental exams once or twice a year. Routine eye exams. Ask your health care provider how often you should have your eyes checked. Personal lifestyle choices, including: Daily care of your teeth and gums. Regular physical activity. Eating a healthy diet. Avoiding tobacco and drug use. Limiting alcohol use. Practicing safe sex. Taking low doses of aspirin every day. Taking vitamin and mineral supplements as recommended by your health care provider. What happens during an annual well check? The services and screenings done by your health care provider during your annual well check will depend on your age, overall health, lifestyle risk factors, and family history of disease. Counseling  Your health care  provider may ask you questions about your: Alcohol use. Tobacco use. Drug use. Emotional well-being. Home and relationship well-being. Sexual activity. Eating habits. History of falls. Memory and ability to understand (cognition). Work and work Statistician. Screening  You may have the following tests or measurements: Height, weight, and BMI. Blood pressure. Lipid and cholesterol levels. These may be checked every 5 years, or more frequently if you are over 62 years old. Skin check. Lung cancer screening. You may have this screening every year starting at age 39 if you have a 30-pack-year history of smoking and currently smoke or have quit within the past 15 years. Fecal occult blood test (FOBT) of the stool. You may have this test every year starting at age 56. Flexible sigmoidoscopy or colonoscopy. You may have a sigmoidoscopy every 5 years or a colonoscopy every 10 years starting at age 34. Prostate cancer screening. Recommendations will vary depending on your family history and other risks. Hepatitis C blood test. Hepatitis B blood test. Sexually transmitted disease (STD) testing. Diabetes screening. This is done by checking your blood sugar (glucose) after you have not eaten for a while (fasting). You may have this done every 1-3 years. Abdominal aortic aneurysm (AAA) screening. You may need this if you are a current or former smoker. Osteoporosis. You may be screened starting at age 47 if you are at high risk. Talk with your health care provider about your test results, treatment options, and if necessary, the need for more tests. Vaccines  Your health care provider may recommend certain vaccines, such as: Influenza vaccine. This is recommended every year. Tetanus, diphtheria, and acellular pertussis (Tdap, Td) vaccine. You may need a Td booster every 10 years. Zoster vaccine. You may need  this after age 73. Pneumococcal 13-valent conjugate (PCV13) vaccine. One dose is  recommended after age 30. Pneumococcal polysaccharide (PPSV23) vaccine. One dose is recommended after age 1. Talk to your health care provider about which screenings and vaccines you need and how often you need them. This information is not intended to replace advice given to you by your health care provider. Make sure you discuss any questions you have with your health care provider. Document Released: 09/27/2015 Document Revised: 05/20/2016 Document Reviewed: 07/02/2015 Elsevier Interactive Patient Education  2017 [place] Prevention in the Home Falls can cause injuries. They can happen to people of all ages. There are many things you can do to make your home safe and to help prevent falls. What can I do on the outside of my home? Regularly fix the edges of walkways and driveways and fix any cracks. Remove anything that might make you trip as you walk through a door, such as a raised step or threshold. Trim any bushes or trees on the path to your home. Use bright outdoor lighting. Clear any walking paths of anything that might make someone trip, such as rocks or tools. Regularly check to see if handrails are loose or broken. Make sure that both sides of any steps have handrails. Any raised decks and porches should have guardrails on the edges. Have any leaves, snow, or ice cleared regularly. Use sand or salt on walking paths during winter. Clean up any spills in your garage right away. This includes oil or grease spills. What can I do in the bathroom? Use night lights. Install grab bars by the toilet and in the tub and shower. Do not use towel bars as grab bars. Use non-skid mats or decals in the tub or shower. If you need to sit down in the shower, use a plastic, non-slip stool. Keep the floor dry. Clean up any water that spills on the floor as soon as it happens. Remove soap buildup in the tub or shower regularly. Attach bath mats securely with double-sided non-slip rug  tape. Do not have throw rugs and other things on the floor that can make you trip. What can I do in the bedroom? Use night lights. Make sure that you have a light by your bed that is easy to reach. Do not use any sheets or blankets that are too big for your bed. They should not hang down onto the floor. Have a firm chair that has side arms. You can use this for support while you get dressed. Do not have throw rugs and other things on the floor that can make you trip. What can I do in the kitchen? Clean up any spills right away. Avoid walking on wet floors. Keep items that you use a lot in easy-to-reach places. If you need to reach something above you, use a strong step stool that has a grab bar. Keep electrical cords out of the way. Do not use floor polish or wax that makes floors slippery. If you must use wax, use non-skid floor wax. Do not have throw rugs and other things on the floor that can make you trip. What can I do with my stairs? Do not leave any items on the stairs. Make sure that there are handrails on both sides of the stairs and use them. Fix handrails that are broken or loose. Make sure that handrails are as long as the stairways. Check any carpeting to make sure that it is firmly attached to  the stairs. Fix any carpet that is loose or worn. Avoid having throw rugs at the top or bottom of the stairs. If you do have throw rugs, attach them to the floor with carpet tape. Make sure that you have a light switch at the top of the stairs and the bottom of the stairs. If you do not have them, ask someone to add them for you. What else can I do to help prevent falls? Wear shoes that: Do not have high heels. Have rubber bottoms. Are comfortable and fit you well. Are closed at the toe. Do not wear sandals. If you use a stepladder: Make sure that it is fully opened. Do not climb a closed stepladder. Make sure that both sides of the stepladder are locked into place. Ask someone to  hold it for you, if possible. Clearly mark and make sure that you can see: Any grab bars or handrails. First and last steps. Where the edge of each step is. Use tools that help you move around (mobility aids) if they are needed. These include: Canes. Walkers. Scooters. Crutches. Turn on the lights when you go into a dark area. Replace any light bulbs as soon as they burn out. Set up your furniture so you have a clear path. Avoid moving your furniture around. If any of your floors are uneven, fix them. If there are any pets around you, be aware of where they are. Review your medicines with your doctor. Some medicines can make you feel dizzy. This can increase your chance of falling. Ask your doctor what other things that you can do to help prevent falls. This information is not intended to replace advice given to you by your health care provider. Make sure you discuss any questions you have with your health care provider. Document Released: 06/27/2009 Document Revised: 02/06/2016 Document Reviewed: 10/05/2014 Elsevier Interactive Patient Education  2017 [employer].

## 2021-09-29 DIAGNOSIS — H9319 Tinnitus, unspecified ear: Secondary | ICD-10-CM | POA: Diagnosis not present

## 2021-09-29 DIAGNOSIS — H90A21 Sensorineural hearing loss, unilateral, right ear, with restricted hearing on the contralateral side: Secondary | ICD-10-CM | POA: Diagnosis not present

## 2021-10-10 ENCOUNTER — Other Ambulatory Visit: Payer: Self-pay | Admitting: Family Medicine

## 2021-10-10 DIAGNOSIS — H6991 Unspecified Eustachian tube disorder, right ear: Secondary | ICD-10-CM

## 2021-10-10 DIAGNOSIS — K219 Gastro-esophageal reflux disease without esophagitis: Secondary | ICD-10-CM

## 2021-10-10 DIAGNOSIS — H6981 Other specified disorders of Eustachian tube, right ear: Secondary | ICD-10-CM

## 2021-10-10 DIAGNOSIS — E785 Hyperlipidemia, unspecified: Secondary | ICD-10-CM

## 2021-10-10 NOTE — Telephone Encounter (Signed)
Requested Prescriptions  Pending Prescriptions Disp Refills   simvastatin (ZOCOR) 20 MG tablet [Pharmacy Med Name: SIMVASTATIN 20 MG TABLET] 90 tablet 1    Sig: TAKE 1 TABLET BY MOUTH EVERY DAY     Cardiovascular:  Antilipid - Statins Failed - 10/10/2021  8:35 AM      Failed - HDL in normal range and within 360 days    HDL  Date Value Ref Range Status  04/11/2021 36 (L) >39 mg/dL Final         Passed - Total Cholesterol in normal range and within 360 days    Cholesterol, Total  Date Value Ref Range Status  04/11/2021 142 100 - 199 mg/dL Final         Passed - LDL in normal range and within 360 days    LDL Chol Calc (NIH)  Date Value Ref Range Status  04/11/2021 82 0 - 99 mg/dL Final         Passed - Triglycerides in normal range and within 360 days    Triglycerides  Date Value Ref Range Status  04/11/2021 135 0 - 149 mg/dL Final         Passed - Patient is not pregnant      Passed - Valid encounter within last 12 months    Recent Outpatient Visits          2 months ago Tinnitus of right ear   [facility] Juline Patch, MD   2 months ago Acute sphenoidal sinusitis, recurrence not specified   [facility] Juline Patch, MD   6 months ago Annual physical exam   [place], Deanna C, MD   1 year ago Hyperlipidemia, unspecified hyperlipidemia type   [facility] Juline Patch, MD   1 year ago Annual physical exam   [place], MD      Future Appointments            In 6 months Juline Patch, MD [facility]            pantoprazole (PROTONIX) 40 MG tablet [Pharmacy Med Name: PANTOPRAZOLE SOD DR 40 MG TAB] 90 tablet 0    Sig: TAKE 1 TABLET BY MOUTH EVERY DAY     Gastroenterology: Proton Pump Inhibitors Passed - 10/10/2021  8:35 AM      Passed - Valid encounter within last 12 months    Recent Outpatient Visits          2 months ago Tinnitus of right ear   [facility] Juline Patch, MD   2 months ago Acute sphenoidal sinusitis, recurrence not specified   [facility] Juline Patch, MD   6 months ago Annual physical exam   [place], Deanna C, MD   1 year ago Hyperlipidemia, unspecified hyperlipidemia type   [facility] Juline Patch, MD   1 year ago Annual physical exam   [facility] Juline Patch, MD      Future Appointments            In 6 months Juline Patch, MD [facility]

## 2021-10-10 NOTE — Telephone Encounter (Signed)
Requested medications are due for refill today.  unsure  Requested medications are on the active medications list.  no  Last refill. 07/15/2021  Future visit scheduled.   yes  Notes to clinic.  Medication was d/c 07/30/2021.    Requested Prescriptions  Pending Prescriptions Disp Refills   montelukast (SINGULAIR) 10 MG tablet [Pharmacy Med Name: MONTELUKAST SOD 10 MG TABLET] 90 tablet 1    Sig: TAKE 1 TABLET BY MOUTH EVERYDAY AT BEDTIME     Pulmonology:  Leukotriene Inhibitors Passed - 10/10/2021  1:34 AM      Passed - Valid encounter within last 12 months    Recent Outpatient Visits           2 months ago Tinnitus of right ear   [facility] Juline Patch, MD   2 months ago Acute sphenoidal sinusitis, recurrence not specified   [facility] Juline Patch, MD   6 months ago Annual physical exam   [facility] Juline Patch, MD   1 year ago Hyperlipidemia, unspecified hyperlipidemia type   [facility] Juline Patch, MD   1 year ago Annual physical exam   [facility] Juline Patch, MD       Future Appointments             In 6 months Juline Patch, MD [facility], [facility]

## 2021-11-19 DIAGNOSIS — Z872 Personal history of diseases of the skin and subcutaneous tissue: Secondary | ICD-10-CM | POA: Diagnosis not present

## 2021-11-19 DIAGNOSIS — L578 Other skin changes due to chronic exposure to nonionizing radiation: Secondary | ICD-10-CM | POA: Diagnosis not present

## 2021-11-19 DIAGNOSIS — L570 Actinic keratosis: Secondary | ICD-10-CM | POA: Diagnosis not present

## 2021-12-09 DIAGNOSIS — H401131 Primary open-angle glaucoma, bilateral, mild stage: Secondary | ICD-10-CM | POA: Diagnosis not present

## 2022-04-15 ENCOUNTER — Encounter: Payer: Self-pay | Admitting: Family Medicine

## 2022-04-15 ENCOUNTER — Ambulatory Visit (INDEPENDENT_AMBULATORY_CARE_PROVIDER_SITE_OTHER): Payer: Medicare HMO | Admitting: Family Medicine

## 2022-04-15 VITALS — BP 120/80 | HR 52 | Ht 71.0 in | Wt 188.0 lb

## 2022-04-15 DIAGNOSIS — E785 Hyperlipidemia, unspecified: Secondary | ICD-10-CM

## 2022-04-15 DIAGNOSIS — R351 Nocturia: Secondary | ICD-10-CM | POA: Diagnosis not present

## 2022-04-15 DIAGNOSIS — Z0000 Encounter for general adult medical examination without abnormal findings: Secondary | ICD-10-CM

## 2022-04-15 LAB — HEMOCCULT GUIAC POC 1CARD (OFFICE): Fecal Occult Blood, POC: NEGATIVE

## 2022-04-15 LAB — POCT URINALYSIS DIPSTICK
Bilirubin, UA: NEGATIVE
Blood, UA: NEGATIVE
Glucose, UA: NEGATIVE
Ketones, UA: NEGATIVE
Leukocytes, UA: NEGATIVE
Nitrite, UA: NEGATIVE
Protein, UA: NEGATIVE
Spec Grav, UA: 1.01
Urobilinogen, UA: 0.2 U/dL
pH, UA: 5
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
## 2022-04-16 LAB — CBC WITH DIFFERENTIAL/PLATELET
Basophils Absolute: 0 10*3/uL (ref 0.0–0.2)
Basos: 0 %
EOS (ABSOLUTE): 0.1 10*3/uL (ref 0.0–0.4)
Eos: 3 %
Hematocrit: 46.3 % (ref 37.5–51.0)
Hemoglobin: 15.7 g/dL (ref 13.0–17.7)
Immature Grans (Abs): 0 10*3/uL (ref 0.0–0.1)
Immature Granulocytes: 0 %
Lymphocytes Absolute: 1 10*3/uL (ref 0.7–3.1)
Lymphs: 20 %
MCH: 31.2 pg (ref 26.6–33.0)
MCHC: 33.9 g/dL (ref 31.5–35.7)
MCV: 92 fL (ref 79–97)
Monocytes Absolute: 0.5 10*3/uL (ref 0.1–0.9)
Monocytes: 9 %
Neutrophils Absolute: 3.5 10*3/uL (ref 1.4–7.0)
Neutrophils: 68 %
Platelets: 181 10*3/uL (ref 150–450)
RBC: 5.04 x10E6/uL (ref 4.14–5.80)
RDW: 12.2 % (ref 11.6–15.4)
WBC: 5.2 10*3/uL (ref 3.4–10.8)

## 2022-04-16 LAB — LIPID PANEL WITH LDL/HDL RATIO
Cholesterol, Total: 139 mg/dL (ref 100–199)
HDL: 41 mg/dL
LDL Chol Calc (NIH): 81 mg/dL (ref 0–99)
LDL/HDL Ratio: 2 ratio (ref 0.0–3.6)
Triglycerides: 90 mg/dL (ref 0–149)
VLDL Cholesterol Cal: 17 mg/dL (ref 5–40)

## 2022-04-16 LAB — COMPREHENSIVE METABOLIC PANEL WITH GFR
ALT: 19 [IU]/L (ref 0–44)
AST: 21 [IU]/L (ref 0–40)
Albumin/Globulin Ratio: 2.1 (ref 1.2–2.2)
Albumin: 4.6 g/dL (ref 3.8–4.8)
Alkaline Phosphatase: 70 [IU]/L (ref 44–121)
BUN/Creatinine Ratio: 23 (ref 10–24)
BUN: 20 mg/dL (ref 8–27)
Bilirubin Total: 0.4 mg/dL (ref 0.0–1.2)
CO2: 23 mmol/L (ref 20–29)
Calcium: 9.7 mg/dL (ref 8.6–10.2)
Chloride: 103 mmol/L (ref 96–106)
Creatinine, Ser: 0.88 mg/dL (ref 0.76–1.27)
Globulin, Total: 2.2 g/dL (ref 1.5–4.5)
Glucose: 114 mg/dL — ABNORMAL HIGH (ref 70–99)
Potassium: 4.5 mmol/L (ref 3.5–5.2)
Sodium: 142 mmol/L (ref 134–144)
Total Protein: 6.8 g/dL (ref 6.0–8.5)
eGFR: 91 mL/min/{1.73_m2}

## 2022-04-16 LAB — PSA: Prostate Specific Ag, Serum: 0.4 ng/mL (ref 0.0–4.0)

## 2022-04-17 ENCOUNTER — Encounter: Payer: Self-pay | Admitting: Family Medicine

## 2022-04-17 ENCOUNTER — Ambulatory Visit (INDEPENDENT_AMBULATORY_CARE_PROVIDER_SITE_OTHER): Payer: Medicare HMO | Admitting: Family Medicine

## 2022-04-17 VITALS — BP 124/62 | HR 58 | Ht 71.0 in | Wt 188.0 lb

## 2022-04-17 DIAGNOSIS — K219 Gastro-esophageal reflux disease without esophagitis: Secondary | ICD-10-CM | POA: Diagnosis not present

## 2022-04-17 DIAGNOSIS — E785 Hyperlipidemia, unspecified: Secondary | ICD-10-CM

## 2022-04-17 MED ORDER — SIMVASTATIN 20 MG PO TABS: 20.0000 mg | ORAL_TABLET | Freq: Every day | ORAL | 1 refills | Status: DC

## 2022-04-17 MED ORDER — PANTOPRAZOLE SODIUM 40 MG PO TBEC: 40.0000 mg | DELAYED_RELEASE_TABLET | Freq: Every day | ORAL | 1 refills | Status: DC
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
## 2022-04-21 DIAGNOSIS — K219 Gastro-esophageal reflux disease without esophagitis: Secondary | ICD-10-CM | POA: Diagnosis not present

## 2022-04-21 DIAGNOSIS — Z008 Encounter for other general examination: Secondary | ICD-10-CM | POA: Diagnosis not present

## 2022-04-21 DIAGNOSIS — Z833 Family history of diabetes mellitus: Secondary | ICD-10-CM | POA: Diagnosis not present

## 2022-04-21 DIAGNOSIS — H409 Unspecified glaucoma: Secondary | ICD-10-CM | POA: Diagnosis not present

## 2022-04-21 DIAGNOSIS — R030 Elevated blood-pressure reading, without diagnosis of hypertension: Secondary | ICD-10-CM | POA: Diagnosis not present

## 2022-04-21 DIAGNOSIS — E785 Hyperlipidemia, unspecified: Secondary | ICD-10-CM | POA: Diagnosis not present

## 2022-04-21 DIAGNOSIS — Z87891 Personal history of nicotine dependence: Secondary | ICD-10-CM | POA: Diagnosis not present

## 2022-06-08 DIAGNOSIS — H40003 Preglaucoma, unspecified, bilateral: Secondary | ICD-10-CM | POA: Diagnosis not present

## 2022-06-11 ENCOUNTER — Other Ambulatory Visit: Payer: Self-pay | Admitting: Family Medicine

## 2022-06-11 ENCOUNTER — Encounter: Payer: Self-pay | Admitting: Family Medicine

## 2022-06-11 ENCOUNTER — Ambulatory Visit (INDEPENDENT_AMBULATORY_CARE_PROVIDER_SITE_OTHER): Payer: Medicare HMO | Admitting: Family Medicine

## 2022-06-11 VITALS — BP 138/80 | HR 64 | Ht 71.0 in | Wt 184.0 lb

## 2022-06-11 DIAGNOSIS — A09 Infectious gastroenteritis and colitis, unspecified: Secondary | ICD-10-CM | POA: Diagnosis not present

## 2022-06-11 MED ORDER — CIPROFLOXACIN HCL 500 MG PO TABS: 500.0000 mg | ORAL_TABLET | Freq: Two times a day (BID) | ORAL | 0 refills | Status: AC
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
## 2022-06-11 NOTE — Addendum Note (Signed)
Addended by: Fredderick Severance on: 06/11/2022 12:55 PM   Modules accepted: Orders

## 2022-06-18 LAB — STOOL CULTURE: E coli, Shiga toxin Assay: NEGATIVE

## 2022-06-18 LAB — OVA AND PARASITE EXAMINATION

## 2022-08-03 ENCOUNTER — Ambulatory Visit: Payer: Medicare HMO

## 2022-08-19 ENCOUNTER — Ambulatory Visit: Payer: Medicare HMO

## 2022-08-20 ENCOUNTER — Other Ambulatory Visit: Payer: Self-pay | Admitting: Family Medicine

## 2022-08-20 DIAGNOSIS — K219 Gastro-esophageal reflux disease without esophagitis: Secondary | ICD-10-CM
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
## 2022-08-26 ENCOUNTER — Ambulatory Visit (INDEPENDENT_AMBULATORY_CARE_PROVIDER_SITE_OTHER): Payer: Medicare HMO

## 2022-08-26 VITALS — Ht 71.0 in | Wt 184.0 lb

## 2022-08-26 DIAGNOSIS — Z0000 Encounter for general adult medical examination without abnormal findings: Secondary | ICD-10-CM

## 2022-09-18 ENCOUNTER — Other Ambulatory Visit: Payer: Self-pay | Admitting: Family Medicine

## 2022-09-18 DIAGNOSIS — E785 Hyperlipidemia, unspecified: Secondary | ICD-10-CM
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
## 2022-10-16 ENCOUNTER — Ambulatory Visit (INDEPENDENT_AMBULATORY_CARE_PROVIDER_SITE_OTHER): Payer: Medicare HMO | Admitting: Family Medicine

## 2022-10-16 ENCOUNTER — Encounter: Payer: Self-pay | Admitting: Family Medicine

## 2022-10-16 VITALS — BP 128/79 | HR 60 | Ht 71.0 in | Wt 181.0 lb

## 2022-10-16 DIAGNOSIS — R739 Hyperglycemia, unspecified: Secondary | ICD-10-CM | POA: Diagnosis not present

## 2022-10-16 DIAGNOSIS — E785 Hyperlipidemia, unspecified: Secondary | ICD-10-CM | POA: Diagnosis not present

## 2022-10-16 DIAGNOSIS — K219 Gastro-esophageal reflux disease without esophagitis: Secondary | ICD-10-CM | POA: Diagnosis not present

## 2022-10-16 MED ORDER — SIMVASTATIN 20 MG PO TABS: 20.0000 mg | ORAL_TABLET | Freq: Every day | ORAL | 1 refills | Status: DC

## 2022-10-16 MED ORDER — PANTOPRAZOLE SODIUM 40 MG PO TBEC: 40.0000 mg | DELAYED_RELEASE_TABLET | Freq: Every day | ORAL | 1 refills | Status: DC
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
## 2022-10-17 LAB — HEMOGLOBIN A1C
Est. average glucose Bld gHb Est-mCnc: 131 mg/dL
Hgb A1c MFr Bld: 6.2 % — ABNORMAL HIGH (ref 4.8–5.6)

## 2022-10-27 ENCOUNTER — Ambulatory Visit (INDEPENDENT_AMBULATORY_CARE_PROVIDER_SITE_OTHER): Payer: Medicare HMO | Admitting: Family Medicine

## 2022-10-27 ENCOUNTER — Encounter: Payer: Self-pay | Admitting: Family Medicine

## 2022-10-27 VITALS — BP 120/78 | HR 58 | Temp 98.4°F | Ht 71.0 in | Wt 191.0 lb

## 2022-10-27 DIAGNOSIS — J0100 Acute maxillary sinusitis, unspecified: Secondary | ICD-10-CM

## 2022-10-27 MED ORDER — PROMETHAZINE-DM 6.25-15 MG/5ML PO SYRP: 5.0000 mL | ORAL_SOLUTION | Freq: Four times a day (QID) | ORAL | 0 refills | Status: DC | PRN

## 2022-10-27 MED ORDER — AMOXICILLIN 500 MG PO CAPS: 500.0000 mg | ORAL_CAPSULE | Freq: Three times a day (TID) | ORAL | 0 refills | Status: AC

## 2022-10-27 MED ORDER — GUAIFENESIN-CODEINE 100-10 MG/5ML PO SYRP: 5.0000 mL | ORAL_SOLUTION | Freq: Three times a day (TID) | ORAL | 0 refills | Status: DC | PRN

## 2022-10-27 NOTE — Addendum Note (Signed)
Addended by: Fredderick Severance on: 10/27/2022 01:14 PM   Modules accepted: Orders
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
## 2022-10-29 ENCOUNTER — Telehealth: Payer: Self-pay | Admitting: Family Medicine

## 2022-10-29 NOTE — Telephone Encounter (Signed)
Pt called after hrs service on 10/28/22 at 10:31pm stating that he "tested positive for Covid using an at home test.  His symptoms started on Monday. He saw the doctor yesterday on 2/13 & was prescribed amoxicillin & promethazine.  He is having a sore throat, unable to taste anything, cough & congestion."  Spoke to Dr Ronnald Ramp who advised pt to continue amoxicillin & promethazine.  He was advised to get an OTC med like Miccinex.  Pt stated he forgot to get that.  Informed pt to go ahead & get the OTC med & continue to rest to let the meds do what they need to do.  Told pt if he does get worse he can reach back out to [place].  Pt verbalized understanding & said he will monitor his symptoms.

## 2022-11-30 DIAGNOSIS — R69 Illness, unspecified: Secondary | ICD-10-CM | POA: Diagnosis not present

## 2022-12-09 DIAGNOSIS — H401131 Primary open-angle glaucoma, bilateral, mild stage: Secondary | ICD-10-CM | POA: Diagnosis not present

## 2023-03-15 DIAGNOSIS — N529 Male erectile dysfunction, unspecified: Secondary | ICD-10-CM | POA: Diagnosis not present

## 2023-03-15 DIAGNOSIS — N181 Chronic kidney disease, stage 1: Secondary | ICD-10-CM | POA: Diagnosis not present

## 2023-03-15 DIAGNOSIS — E785 Hyperlipidemia, unspecified: Secondary | ICD-10-CM | POA: Diagnosis not present

## 2023-03-15 DIAGNOSIS — Z823 Family history of stroke: Secondary | ICD-10-CM | POA: Diagnosis not present

## 2023-03-15 DIAGNOSIS — Z811 Family history of alcohol abuse and dependence: Secondary | ICD-10-CM | POA: Diagnosis not present

## 2023-03-15 DIAGNOSIS — I129 Hypertensive chronic kidney disease with stage 1 through stage 4 chronic kidney disease, or unspecified chronic kidney disease: Secondary | ICD-10-CM | POA: Diagnosis not present

## 2023-03-15 DIAGNOSIS — Z87891 Personal history of nicotine dependence: Secondary | ICD-10-CM | POA: Diagnosis not present

## 2023-03-15 DIAGNOSIS — Z8249 Family history of ischemic heart disease and other diseases of the circulatory system: Secondary | ICD-10-CM | POA: Diagnosis not present

## 2023-03-15 DIAGNOSIS — K219 Gastro-esophageal reflux disease without esophagitis: Secondary | ICD-10-CM | POA: Diagnosis not present

## 2023-03-15 DIAGNOSIS — I7090 Unspecified atherosclerosis: Secondary | ICD-10-CM | POA: Diagnosis not present

## 2023-04-20 ENCOUNTER — Encounter: Payer: Self-pay | Admitting: Family Medicine

## 2023-04-20 ENCOUNTER — Ambulatory Visit (INDEPENDENT_AMBULATORY_CARE_PROVIDER_SITE_OTHER): Payer: Medicare HMO | Admitting: Family Medicine

## 2023-04-20 VITALS — BP 132/82 | HR 56 | Ht 71.0 in | Wt 188.0 lb

## 2023-04-20 DIAGNOSIS — R739 Hyperglycemia, unspecified: Secondary | ICD-10-CM

## 2023-04-20 DIAGNOSIS — R030 Elevated blood-pressure reading, without diagnosis of hypertension: Secondary | ICD-10-CM

## 2023-04-20 DIAGNOSIS — Z0000 Encounter for general adult medical examination without abnormal findings: Secondary | ICD-10-CM | POA: Diagnosis not present

## 2023-04-20 DIAGNOSIS — R7303 Prediabetes: Secondary | ICD-10-CM | POA: Diagnosis not present

## 2023-04-20 LAB — POCT URINALYSIS DIPSTICK
Bilirubin, UA: NEGATIVE
Blood, UA: NEGATIVE
Glucose, UA: NEGATIVE
Ketones, UA: NEGATIVE
Leukocytes, UA: NEGATIVE
Nitrite, UA: NEGATIVE
Protein, UA: NEGATIVE
Spec Grav, UA: 1.01
Urobilinogen, UA: 0.2 U/dL
pH, UA: 6

## 2023-04-20 LAB — HEMOCCULT GUIAC POC 1CARD (OFFICE): Fecal Occult Blood, POC: NEGATIVE

## 2023-04-20 MED ORDER — LISINOPRIL 5 MG PO TABS: 5.0000 mg | ORAL_TABLET | Freq: Every day | ORAL | 1 refills | Status: DC
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
## 2023-04-20 NOTE — Patient Instructions (Signed)

## 2023-04-21 LAB — COMPREHENSIVE METABOLIC PANEL WITH GFR
ALT: 18 [IU]/L (ref 0–44)
AST: 21 [IU]/L (ref 0–40)
Albumin: 4.6 g/dL (ref 3.8–4.8)
Alkaline Phosphatase: 60 [IU]/L (ref 44–121)
BUN/Creatinine Ratio: 19 (ref 10–24)
BUN: 18 mg/dL (ref 8–27)
Bilirubin Total: 0.4 mg/dL (ref 0.0–1.2)
CO2: 23 mmol/L (ref 20–29)
Calcium: 9.3 mg/dL (ref 8.6–10.2)
Chloride: 105 mmol/L (ref 96–106)
Creatinine, Ser: 0.97 mg/dL (ref 0.76–1.27)
Globulin, Total: 2.2 g/dL (ref 1.5–4.5)
Glucose: 112 mg/dL — ABNORMAL HIGH (ref 70–99)
Potassium: 4.4 mmol/L (ref 3.5–5.2)
Sodium: 143 mmol/L (ref 134–144)
Total Protein: 6.8 g/dL (ref 6.0–8.5)
eGFR: 82 mL/min/{1.73_m2}

## 2023-04-21 LAB — HEMOGLOBIN A1C
Est. average glucose Bld gHb Est-mCnc: 126 mg/dL
Hgb A1c MFr Bld: 6 % — ABNORMAL HIGH (ref 4.8–5.6)

## 2023-04-21 LAB — LIPID PANEL WITH LDL/HDL RATIO
Cholesterol, Total: 143 mg/dL (ref 100–199)
HDL: 41 mg/dL
LDL Chol Calc (NIH): 85 mg/dL (ref 0–99)
LDL/HDL Ratio: 2.1 ratio (ref 0.0–3.6)
Triglycerides: 91 mg/dL (ref 0–149)
VLDL Cholesterol Cal: 17 mg/dL (ref 5–40)

## 2023-04-21 LAB — PSA: Prostate Specific Ag, Serum: 0.4 ng/mL (ref 0.0–4.0)

## 2023-05-13 ENCOUNTER — Ambulatory Visit (INDEPENDENT_AMBULATORY_CARE_PROVIDER_SITE_OTHER): Payer: Medicare HMO | Admitting: Family Medicine

## 2023-05-13 ENCOUNTER — Encounter: Payer: Self-pay | Admitting: Family Medicine

## 2023-05-13 VITALS — BP 112/60 | HR 74 | Ht 71.0 in | Wt 185.0 lb

## 2023-05-13 DIAGNOSIS — K219 Gastro-esophageal reflux disease without esophagitis: Secondary | ICD-10-CM

## 2023-05-13 DIAGNOSIS — R030 Elevated blood-pressure reading, without diagnosis of hypertension: Secondary | ICD-10-CM | POA: Diagnosis not present

## 2023-05-13 DIAGNOSIS — E785 Hyperlipidemia, unspecified: Secondary | ICD-10-CM

## 2023-05-13 MED ORDER — LISINOPRIL 5 MG PO TABS: 5.0000 mg | ORAL_TABLET | Freq: Every day | ORAL | 1 refills | Status: DC

## 2023-05-13 MED ORDER — PANTOPRAZOLE SODIUM 40 MG PO TBEC: 40.0000 mg | DELAYED_RELEASE_TABLET | Freq: Every day | ORAL | 1 refills | Status: DC

## 2023-05-13 MED ORDER — SIMVASTATIN 20 MG PO TABS: 20.0000 mg | ORAL_TABLET | Freq: Every day | ORAL | 1 refills | Status: DC
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
## 2023-06-08 DIAGNOSIS — H401131 Primary open-angle glaucoma, bilateral, mild stage: Secondary | ICD-10-CM | POA: Diagnosis not present

## 2023-07-02 DIAGNOSIS — L578 Other skin changes due to chronic exposure to nonionizing radiation: Secondary | ICD-10-CM | POA: Diagnosis not present

## 2023-07-02 DIAGNOSIS — Z872 Personal history of diseases of the skin and subcutaneous tissue: Secondary | ICD-10-CM | POA: Diagnosis not present

## 2023-07-02 DIAGNOSIS — L821 Other seborrheic keratosis: Secondary | ICD-10-CM | POA: Diagnosis not present

## 2023-07-02 DIAGNOSIS — L570 Actinic keratosis: Secondary | ICD-10-CM | POA: Diagnosis not present

## 2023-07-22 ENCOUNTER — Ambulatory Visit (INDEPENDENT_AMBULATORY_CARE_PROVIDER_SITE_OTHER): Payer: Medicare HMO | Admitting: Family Medicine

## 2023-07-22 ENCOUNTER — Encounter: Payer: Self-pay | Admitting: Family Medicine

## 2023-07-22 VITALS — BP 132/78 | HR 66 | Ht 71.0 in | Wt 186.0 lb

## 2023-07-22 DIAGNOSIS — I10 Essential (primary) hypertension: Secondary | ICD-10-CM | POA: Diagnosis not present

## 2023-07-22 DIAGNOSIS — E785 Hyperlipidemia, unspecified: Secondary | ICD-10-CM | POA: Diagnosis not present

## 2023-07-22 DIAGNOSIS — K219 Gastro-esophageal reflux disease without esophagitis: Secondary | ICD-10-CM

## 2023-07-22 MED ORDER — LISINOPRIL 10 MG PO TABS: 10.0000 mg | ORAL_TABLET | Freq: Every day | ORAL | 1 refills | Status: DC

## 2023-07-22 MED ORDER — SIMVASTATIN 20 MG PO TABS: 20.0000 mg | ORAL_TABLET | Freq: Every day | ORAL | 1 refills | Status: DC

## 2023-07-22 MED ORDER — PANTOPRAZOLE SODIUM 40 MG PO TBEC: 40.0000 mg | DELAYED_RELEASE_TABLET | Freq: Every day | ORAL | 1 refills | Status: DC
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
## 2023-07-22 NOTE — Patient Instructions (Signed)
Managing Your Hypertension Hypertension, also called high blood pressure, is when the force of the blood pressing against the walls of the arteries is too strong. Arteries are blood vessels that carry blood from your heart throughout your body. Hypertension forces the heart to work harder to pump blood and may cause the arteries to become narrow or stiff. Understanding blood pressure readings A blood pressure reading includes a higher number over a lower number: The first, or top, number is called the systolic pressure. It is a measure of the pressure in your arteries as your heart beats. The second, or bottom number, is called the diastolic pressure. It is a measure of the pressure in your arteries as the heart relaxes. For most people, a normal blood pressure is below 120/80. Your personal target blood pressure may vary depending on your medical conditions, your age, and other factors. Blood pressure is classified into four stages. Based on your blood pressure reading, your health care provider may use the following stages to determine what type of treatment you need, if any. Systolic pressure and diastolic pressure are measured in a unit called millimeters of mercury (mmHg). Normal Systolic pressure: below 120. Diastolic pressure: below 80. Elevated Systolic pressure: 120-129. Diastolic pressure: below 80. Hypertension stage 1 Systolic pressure: 130-139. Diastolic pressure: 80-89. Hypertension stage 2 Systolic pressure: 140 or above. Diastolic pressure: 90 or above. How can this condition affect me? Managing your hypertension is very important. Over time, hypertension can damage the arteries and decrease blood flow to parts of the body, including the brain, heart, and kidneys. Having untreated or uncontrolled hypertension can lead to: A heart attack. A stroke. A weakened blood vessel (aneurysm). Heart failure. Kidney damage. Eye damage. Memory and concentration problems. Vascular  dementia. What actions can I take to manage this condition? Hypertension can be managed by making lifestyle changes and possibly by taking medicines. Your health care provider will help you make a plan to bring your blood pressure within a normal range. You may be referred for counseling on a healthy diet and physical activity. Nutrition  Eat a diet that is high in fiber and potassium, and low in salt (sodium), added sugar, and fat. An example eating plan is called the DASH diet. DASH stands for Dietary Approaches to Stop Hypertension. To eat this way: Eat plenty of fresh fruits and vegetables. Try to fill one-half of your plate at each meal with fruits and vegetables. Eat whole grains, such as whole-wheat pasta, brown rice, or whole-grain bread. Fill about one-fourth of your plate with whole grains. Eat low-fat dairy products. Avoid fatty cuts of meat, processed or cured meats, and poultry with skin. Fill about one-fourth of your plate with lean proteins such as fish, chicken without skin, beans, eggs, and tofu. Avoid pre-made and processed foods. These tend to be higher in sodium, added sugar, and fat. Reduce your daily sodium intake. Many people with hypertension should eat less than 1,500 mg of sodium a day. Lifestyle  Work with your health care provider to maintain a healthy body weight or to lose weight. Ask what an ideal weight is for you. Get at least 30 minutes of exercise that causes your heart to beat faster (aerobic exercise) most days of the week. Activities may include walking, swimming, or biking. Include exercise to strengthen your muscles (resistance exercise), such as weight lifting, as part of your weekly exercise routine. Try to do these types of exercises for 30 minutes at least 3 days a week. Do   not use any products that contain nicotine or tobacco. These products include cigarettes, chewing tobacco, and vaping devices, such as e-cigarettes. If you need help quitting, ask your  health care provider. Control any long-term (chronic) conditions you have, such as high cholesterol or diabetes. Identify your sources of stress and find ways to manage stress. This may include meditation, deep breathing, or making time for fun activities. Alcohol use Do not drink alcohol if: Your health care provider tells you not to drink. You are pregnant, may be pregnant, or are planning to become pregnant. If you drink alcohol: Limit how much you have to: 0-1 drink a day for women. 0-2 drinks a day for men. Know how much alcohol is in your drink. In the [place] one drink equals one 12 oz bottle of beer (355 mL), one 5 oz glass of wine (148 mL), or one 1 oz glass of hard liquor (44 mL). Medicines Your health care provider may prescribe medicine if lifestyle changes are not enough to get your blood pressure under control and if: Your systolic blood pressure is 130 or higher. Your diastolic blood pressure is 80 or higher. Take medicines only as told by your health care provider. Follow the directions carefully. Blood pressure medicines must be taken as told by your health care provider. The medicine does not work as well when you skip doses. Skipping doses also puts you at risk for problems. Monitoring Before you monitor your blood pressure: Do not smoke, drink caffeinated beverages, or exercise within 30 minutes before taking a measurement. Use the bathroom and empty your bladder (urinate). Sit quietly for at least 5 minutes before taking measurements. Monitor your blood pressure at home as told by your health care provider. To do this: Sit with your back straight and supported. Place your feet flat on the floor. Do not cross your legs. Support your arm on a flat surface, such as a table. Make sure your upper arm is at heart level. Each time you measure, take two or three readings one minute apart and record the results. You may also need to have your blood pressure checked regularly by  your health care provider. General information Talk with your health care provider about your diet, exercise habits, and other lifestyle factors that may be contributing to hypertension. Review all the medicines you take with your health care provider because there may be side effects or interactions. Keep all follow-up visits. Your health care provider can help you create and adjust your plan for managing your high blood pressure. Where to find more information National Heart, Lung, and Blood Institute: www.nhlbi.nih.gov American Heart Association: www.heart.org Contact a health care provider if: You think you are having a reaction to medicines you have taken. You have repeated (recurrent) headaches. You feel dizzy. You have swelling in your ankles. You have trouble with your vision. Get help right away if: You develop a severe headache or confusion. You have unusual weakness or numbness, or you feel faint. You have severe pain in your chest or abdomen. You vomit repeatedly. You have trouble breathing. These symptoms may be an emergency. Get help right away. Call 911. Do not wait to see if the symptoms will go away. Do not drive yourself to the hospital. Summary Hypertension is when the force of blood pumping through your arteries is too strong. If this condition is not controlled, it may put you at risk for serious complications. Your personal target blood pressure may vary depending on your medical conditions,   your age, and other factors. For most people, a normal blood pressure is less than 120/80. Hypertension is managed by lifestyle changes, medicines, or both. Lifestyle changes to help manage hypertension include losing weight, eating a healthy, low-sodium diet, exercising more, stopping smoking, and limiting alcohol. This information is not intended to replace advice given to you by your health care provider. Make sure you discuss any questions you have with your health care  provider. Document Revised: 05/15/2021 Document Reviewed: 05/15/2021 Elsevier Patient Education  2024 [employer]vier Inc.  

## 2023-07-23 ENCOUNTER — Encounter: Payer: Self-pay | Admitting: Family Medicine

## 2023-07-23 LAB — RENAL FUNCTION PANEL
Albumin: 4.7 g/dL (ref 3.8–4.8)
BUN/Creatinine Ratio: 22 (ref 10–24)
BUN: 25 mg/dL (ref 8–27)
CO2: 24 mmol/L (ref 20–29)
Calcium: 9.8 mg/dL (ref 8.6–10.2)
Chloride: 102 mmol/L (ref 96–106)
Creatinine, Ser: 1.14 mg/dL (ref 0.76–1.27)
Glucose: 113 mg/dL — ABNORMAL HIGH (ref 70–99)
Phosphorus: 3 mg/dL (ref 2.8–4.1)
Potassium: 4.7 mmol/L (ref 3.5–5.2)
Sodium: 139 mmol/L (ref 134–144)
eGFR: 67 mL/min/{1.73_m2}

## 2023-08-30 ENCOUNTER — Telehealth: Payer: Self-pay | Admitting: Family Medicine

## 2023-08-30 NOTE — Telephone Encounter (Signed)
Copied from CRM [id number]. Topic: General - Inquiry >> Aug 30, 2023 11:08 AM Haroldine Laws wrote: Reason for CRM: pt called saying he lost your cell number and he would like for Dr. Yetta Barre to call him.  Not medical related.

## 2023-08-30 NOTE — Telephone Encounter (Signed)
Dr. Yetta Barre is aware.  KP

## 2023-09-02 DIAGNOSIS — K219 Gastro-esophageal reflux disease without esophagitis: Secondary | ICD-10-CM | POA: Diagnosis not present

## 2023-09-02 DIAGNOSIS — I10 Essential (primary) hypertension: Secondary | ICD-10-CM | POA: Diagnosis not present

## 2023-09-02 DIAGNOSIS — R7302 Impaired glucose tolerance (oral): Secondary | ICD-10-CM | POA: Diagnosis not present

## 2023-09-02 DIAGNOSIS — Z1331 Encounter for screening for depression: Secondary | ICD-10-CM | POA: Diagnosis not present

## 2023-09-02 DIAGNOSIS — E785 Hyperlipidemia, unspecified: Secondary | ICD-10-CM | POA: Diagnosis not present

## 2023-09-02 DIAGNOSIS — Z0000 Encounter for general adult medical examination without abnormal findings: Secondary | ICD-10-CM | POA: Diagnosis not present

## 2023-10-12 ENCOUNTER — Ambulatory Visit
Admission: EM | Admit: 2023-10-12 | Discharge: 2023-10-12 | Disposition: A | Discharge status: Home / Self Care | Payer: Medicare HMO

## 2023-10-12 ENCOUNTER — Encounter: Payer: Self-pay | Admitting: Emergency Medicine

## 2023-10-12 DIAGNOSIS — S61217A Laceration without foreign body of left little finger without damage to nail, initial encounter: Secondary | ICD-10-CM

## 2023-10-12 NOTE — ED Triage Notes (Addendum)
Pt has a laceration to his left pinky today. A spring from his garage popped off and cut his finger. Last tetanus 03/07/18
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
## 2023-10-12 NOTE — Discharge Instructions (Addendum)
-  Do not get area wet for 2 days then do not submerge in water. Clean gently with soap and water daily. -Return in 10 days for suture removal -Return sooner if signs of infection. May take home amoxicillin for prophylaxis -May wear splint to try to prevent sutures from opening. -May take Tylenol for pain.

## 2023-10-18 DIAGNOSIS — J069 Acute upper respiratory infection, unspecified: Secondary | ICD-10-CM | POA: Diagnosis not present

## 2023-10-22 ENCOUNTER — Ambulatory Visit
Admission: EM | Admit: 2023-10-22 | Discharge: 2023-10-22 | Disposition: A | Discharge status: Home / Self Care | Payer: Medicare HMO | Attending: Family Medicine | Admitting: Family Medicine

## 2023-10-22 NOTE — ED Triage Notes (Signed)
 Pt presents for suture removal from visit on 1.28.25.   PT had 6 simple sutures placed on left small finger.   Pt states that the sutures are not bothering him.  PT voiced no concerns or discomfort at time of injection

## 2023-12-06 DIAGNOSIS — H401131 Primary open-angle glaucoma, bilateral, mild stage: Secondary | ICD-10-CM | POA: Diagnosis not present

## 2023-12-06 DIAGNOSIS — H2513 Age-related nuclear cataract, bilateral: Secondary | ICD-10-CM | POA: Diagnosis not present

## 2024-01-24 DIAGNOSIS — Z833 Family history of diabetes mellitus: Secondary | ICD-10-CM | POA: Diagnosis not present

## 2024-01-24 DIAGNOSIS — Z8249 Family history of ischemic heart disease and other diseases of the circulatory system: Secondary | ICD-10-CM | POA: Diagnosis not present

## 2024-01-24 DIAGNOSIS — N182 Chronic kidney disease, stage 2 (mild): Secondary | ICD-10-CM | POA: Diagnosis not present

## 2024-01-24 DIAGNOSIS — Z87891 Personal history of nicotine dependence: Secondary | ICD-10-CM | POA: Diagnosis not present

## 2024-01-24 DIAGNOSIS — I129 Hypertensive chronic kidney disease with stage 1 through stage 4 chronic kidney disease, or unspecified chronic kidney disease: Secondary | ICD-10-CM | POA: Diagnosis not present

## 2024-01-24 DIAGNOSIS — N529 Male erectile dysfunction, unspecified: Secondary | ICD-10-CM | POA: Diagnosis not present

## 2024-01-24 DIAGNOSIS — Z008 Encounter for other general examination: Secondary | ICD-10-CM | POA: Diagnosis not present

## 2024-01-24 DIAGNOSIS — Z823 Family history of stroke: Secondary | ICD-10-CM | POA: Diagnosis not present

## 2024-01-24 DIAGNOSIS — R7303 Prediabetes: Secondary | ICD-10-CM | POA: Diagnosis not present

## 2024-01-24 DIAGNOSIS — K219 Gastro-esophageal reflux disease without esophagitis: Secondary | ICD-10-CM | POA: Diagnosis not present

## 2024-01-24 DIAGNOSIS — I7090 Unspecified atherosclerosis: Secondary | ICD-10-CM | POA: Diagnosis not present

## 2024-01-24 DIAGNOSIS — H409 Unspecified glaucoma: Secondary | ICD-10-CM | POA: Diagnosis not present

## 2024-01-24 DIAGNOSIS — E785 Hyperlipidemia, unspecified: Secondary | ICD-10-CM | POA: Diagnosis not present

## 2024-03-13 ENCOUNTER — Telehealth: Payer: Self-pay

## 2024-03-13 ENCOUNTER — Other Ambulatory Visit: Payer: Self-pay

## 2024-03-13 DIAGNOSIS — I10 Essential (primary) hypertension: Secondary | ICD-10-CM

## 2024-03-13 DIAGNOSIS — E785 Hyperlipidemia, unspecified: Secondary | ICD-10-CM

## 2024-03-13 DIAGNOSIS — K219 Gastro-esophageal reflux disease without esophagitis: Secondary | ICD-10-CM

## 2024-03-13 MED ORDER — PANTOPRAZOLE SODIUM 40 MG PO TBEC: 40.0000 mg | DELAYED_RELEASE_TABLET | Freq: Every day | ORAL | 0 refills | Status: AC

## 2024-03-13 MED ORDER — LISINOPRIL 10 MG PO TABS: 10.0000 mg | ORAL_TABLET | Freq: Every day | ORAL | 0 refills | Status: AC

## 2024-03-13 MED ORDER — SIMVASTATIN 20 MG PO TABS: 20.0000 mg | ORAL_TABLET | Freq: Every day | ORAL | 0 refills | Status: AC

## 2024-03-13 NOTE — Telephone Encounter (Signed)
 Patient needs appt with new provider in the next 30 days. Please call.   Thank you!

## 2024-03-13 NOTE — Telephone Encounter (Signed)
 Patient had transferred to another pcp.

## 2024-04-03 DIAGNOSIS — Z1331 Encounter for screening for depression: Secondary | ICD-10-CM | POA: Diagnosis not present

## 2024-04-03 DIAGNOSIS — Z125 Encounter for screening for malignant neoplasm of prostate: Secondary | ICD-10-CM | POA: Diagnosis not present

## 2024-04-03 DIAGNOSIS — K219 Gastro-esophageal reflux disease without esophagitis: Secondary | ICD-10-CM | POA: Diagnosis not present

## 2024-04-03 DIAGNOSIS — I10 Essential (primary) hypertension: Secondary | ICD-10-CM | POA: Diagnosis not present

## 2024-04-03 DIAGNOSIS — R7302 Impaired glucose tolerance (oral): Secondary | ICD-10-CM | POA: Diagnosis not present

## 2024-04-03 DIAGNOSIS — E785 Hyperlipidemia, unspecified: Secondary | ICD-10-CM | POA: Diagnosis not present

## 2024-04-05 ENCOUNTER — Other Ambulatory Visit: Payer: Self-pay | Admitting: Family Medicine

## 2024-04-05 DIAGNOSIS — E785 Hyperlipidemia, unspecified: Secondary | ICD-10-CM

## 2024-04-06 NOTE — Telephone Encounter (Signed)
 Requested Prescriptions  Pending Prescriptions Disp Refills   simvastatin  (ZOCOR ) 20 MG tablet [Pharmacy Med Name: SIMVASTATIN  20 MG TABLET] 90 tablet 1    Sig: TAKE 1 TABLET BY MOUTH EVERY DAY     Cardiovascular:  Antilipid - Statins Failed - 04/06/2024  5:22 PM      Failed - Valid encounter within last 12 months    Recent Outpatient Visits   None            Failed - Lipid Panel in normal range within the last 12 months    Cholesterol, Total  Date Value Ref Range Status  04/20/2023 143 100 - 199 mg/dL Final   LDL Chol Calc (NIH)  Date Value Ref Range Status  04/20/2023 85 0 - 99 mg/dL Final   HDL  Date Value Ref Range Status  04/20/2023 41 >39 mg/dL Final   Triglycerides  Date Value Ref Range Status  04/20/2023 91 0 - 149 mg/dL Final         Passed - Patient is not pregnant

## 2024-06-12 DIAGNOSIS — H401131 Primary open-angle glaucoma, bilateral, mild stage: Secondary | ICD-10-CM | POA: Diagnosis not present

## 2024-06-12 DIAGNOSIS — H2513 Age-related nuclear cataract, bilateral: Secondary | ICD-10-CM | POA: Diagnosis not present

## 2024-07-03 DIAGNOSIS — D485 Neoplasm of uncertain behavior of skin: Secondary | ICD-10-CM | POA: Diagnosis not present

## 2024-07-03 DIAGNOSIS — Z872 Personal history of diseases of the skin and subcutaneous tissue: Secondary | ICD-10-CM | POA: Diagnosis not present

## 2024-07-03 DIAGNOSIS — L820 Inflamed seborrheic keratosis: Secondary | ICD-10-CM | POA: Diagnosis not present

## 2024-07-03 DIAGNOSIS — L578 Other skin changes due to chronic exposure to nonionizing radiation: Secondary | ICD-10-CM | POA: Diagnosis not present

## 2024-08-16 NOTE — Progress Notes (Signed)
 Miguel Adams is a 78 y.o. male that comes today for the following problem(s):   Chief Complaint  Patient presents with   Annual Exam    AWV    HPI: Patient in the office for complete physical and follow-up of chronic medical additions including hypertension, hyperlipidemia, impaired glucose tolerance and GERD.  He is also in the office for his Medicare annual wellness visit.  He states he is feeling well and is without acute complaint or concern.  He reports his blood pressures have been well-controlled with the amlodipine.  He is eating a healthy diet and remains active to control his blood sugar.  He continues to take his simvastatin  regularly for the control of his cholesterol.  His reflux remains well-controlled with avoidance of triggers and pantoprazole .  MEDICARE WELLNESS VISIT  Providers Rendering Care 1. Dr. Cheryl Jericho (PCP)   Functional Assessment (1) Hearing: Demonstrates no difficulty in hearing during normal conversation (2) Risk of Falls: Patient denies any falls or near falls in the last year, Gait steady without assistance during walk from waiting area to exam room (3) Home Safety: Patient feels secure in their home, There are operational smoke alarms in multiple areas of the home (4) Activities of Daily Living: Independently manages personal grooming and household chores, including cooking, cleaning and laundry. Manages Personal finances without assistance.  Depression Screening Denies loss of interest in normal activities, has no episodes of weeping or anxiety and reports no changes in appetite or sleep.  Cognitive Impairment Patient denies episodes of loosing things, being forgetful. Seems oriented to person, place and time. Responses appear appropriate and timely to this observer.  PREVENTION PLAN  Cardiovascular: Last LDL - 82 Diabetes: Last A1c - 6.1 Glaucoma: N/A Hepatitis B (HBV) Vaccine:  Not Applicable Smoking Cessation:  Not  Applicable  Other Personalized Health Advice  Encouraged patient to exercise 5 days a week, walking, water  aerobics, gentle stretching recommended. Increase dietary intake of fresh fruits and vegetables, reduce red meat to twice a week.  End of Life Counseling Patient has living will in place; [place] - wife; Full Code   Patient Active Problem List  Diagnosis   Status post arthroscopy of left knee   Gastroesophageal reflux disease   Hyperlipidemia   Prediabetes     Past Medical History:  Diagnosis Date   GERD (gastroesophageal reflux disease)    Glaucoma (increased eye pressure)    Hyperlipidemia    Hypertension    Tinnitus      Past Surgical History:  Procedure Laterality Date   COLONOSCOPY  04/01/2017   Repeat in 10 years   KNEE ARTHROSCOPY Left 10/04/2018   Partial medial and lateral menisectomy, parameniscal cyst excision, limited synovectomy    Social History   Socioeconomic History   Marital status: Married  Tobacco Use   Smoking status: Former   Smokeless tobacco: Never  Vaping Use   Vaping status: Never Used  Substance and Sexual Activity   Alcohol use: Yes   Drug use: Never   Social Drivers of Corporate Investment Banker Strain: Low Risk  (08/16/2024)   Overall Financial Resource Strain (CARDIA)    Difficulty of Paying Living Expenses: Not hard at all  Food Insecurity: No Food Insecurity (08/16/2024)   Hunger Vital Sign    Worried About Running Out of Food in the Last Year: Never true    Ran Out of Food in the Last Year: Never true  Transportation Needs: No Transportation Needs (08/16/2024)   PRAPARE -  Administrator, Civil Service (Medical): No    Lack of Transportation (Non-Medical): No  Physical Activity: Sufficiently Active (07/18/2023)   Received from [facility]   Exercise Vital Sign    On average, how many days per week do you engage in moderate to strenuous exercise (like a brisk walk)?: 3 days    On  average, how many minutes do you engage in exercise at this level?: 100 min  Stress: No Stress Concern Present (07/18/2023)   Received from [facility] - Occupational Stress Questionnaire    Feeling of Stress : Not at all  Social Connections: Moderately Isolated (07/18/2023)   Received from [facility]   Social Connection and Isolation Panel    In a typical week, how many times do you talk on the phone with family, friends, or neighbors?: Three times a week    How often do you get together with friends or relatives?: Twice a week    How often do you attend church or religious services?: Never    Do you belong to any clubs or organizations such as church groups, unions, fraternal or athletic groups, or school groups?: No    Are you married, widowed, divorced, separated, never married, or living with a partner?: Married  Housing Stability: Low Risk  (08/16/2024)   Housing Stability Vital Sign    Unable to Pay for Housing in the Last Year: No    Number of Times Moved in the Last Year: 0    Homeless in the Last Year: No    Family History  Problem Relation Name Age of Onset   No Known Problems Mother     Stroke Father     Alcohol abuse Father     Stroke Maternal Grandfather        Current Outpatient Medications:    amLODIPine (NORVASC) 2.5 MG tablet, TAKE 1 TABLET BY MOUTH EVERY DAY, Disp: 90 tablet, Rfl: 3   DENTAGEL 1.1 % dental gel, APPLY A PEA-SIZED AMOUNT OF PASTE TO A TOOTHBRUSH AND BRUSH FOR 2 MINUTES, Disp: , Rfl:    pantoprazole  (PROTONIX ) 40 MG DR tablet, Take 1 tablet (40 mg total) by mouth once daily, Disp: 90 tablet, Rfl: 3   simvastatin  (ZOCOR ) 20 MG tablet, Take 1 tablet (20 mg total) by mouth at bedtime, Disp: 90 tablet, Rfl: 3   timolol maleate (TIMOPTIC) 0.5 % ophthalmic solution, Place 1 drop into both eyes 2 (two) times daily , Disp: , Rfl:    ROS: CONSTITUTIONAL: Patient denies fevers, unusual weight loss  or weight gain, night sweats.  SKIN:  Denies skin rash, dry skin.   HEENT:  Denies unusual headaches, visual changes, double vision.  No tinnitus or new hearing changes.  No sore throat, hoarseness, neck pain, swelling or swollen glands.   BREASTS:  No nipple discharge or breast nodules or masses.   CARDIOVASCULAR:  No unusual shortness of breath, dyspnea on exertion or chest discomforts.  RESPIRATORY:  Patient denies orthopnea, PND or pedal edema.  Patient denies any new cough.  GI:  No dysphagia, abdominal pain, heartburn, change of bowel habits or GI bleeding.  GU:  No urinary frequency, urgency, dysuria, hematuria or hesitancy.  MUSCULOSKELETAL:  No extremity pain, tenderness, swelling, redness, warmth or weakness. NEUROLOGICAL:  No facial weakness or paresthesias.  No extremity weakness or paresthesias.  No unusual loss of balance, ataxia, syncope or near syncope.   ENDOCRINE:  No heat or cold  intolerance, excessive sweating, hunger or thirst.   PSYCHOLOGICAL:  No mood changes.         Objective:  BP 130/88 (BP Location: Left upper arm, Patient Position: Sitting, BP Cuff Size: Large Adult)   Pulse 70   Ht 180.3 cm (5' 11)   Wt 87.1 kg (192 lb)   SpO2 99%   BMI 26.78 kg/m   Physical Examination:  GENERAL:  The patient is alert, oriented and in no acute distress.  HEENT:  Head is normocephalic/atraumatic.  Pupils equal, round and reactive to light and accommodation.  Extraocular movements intact.  Ears, nose and throat are clear. NECK:  Supple without thyromegaly or lymphadenopathy.   CHEST:  Chest wall is within normal limits.   LUNGS:  Clear to auscultation and percussion.   CARDIAC:  Regular rate and rhythm, normal S1 and S2 without murmurs, rubs or gallops.   VASCULAR:  Carotid and femoral pulses 2+ without bruits. No abdominal bruits noted. Distal pulses 2+.   ABDOMEN:  Soft, with normal bowel sounds.  No organomegaly or tenderness found.  Reducible left inguinal  hernia GENITALIA:  Penis normal.  Testes normally descended.  Scrotum revealed no masses or tenderness.   EXTREMITIES:  Full range of motion with no erythema, heat or effusion.  No cyanosis, clubbing or edema noted.  NEUROLOGIC:  The patient is alert and oriented.  Cranial nerves II-XII intact.  Motor and sensory examinations within normal limits.  Cerebellar testing normal.  Gait normal.  Reflexes symmetric and brisk.   DERMATOLOGIC:  No skin lesions, scaling or bruising noted.  Nail beds normal.   LYMPH:  Lymph node survey revealed no cervical, supraclavicular, axillary or inguinal nodes.      A/P  Orders Placed This Encounter  Procedures   Comprehensive Metabolic Panel (CMP)    Standing Status:   Future    Expected Date:   02/07/2025    Expiration Date:   05/08/2025    Release to patient:   Immediate   Lipid Panel w/calc LDL    Standing Status:   Future    Expected Date:   02/07/2025    Expiration Date:   05/08/2025    Release to patient:   Immediate   Hemoglobin A1C    Standing Status:   Future    Expected Date:   02/07/2025    Expiration Date:   05/08/2025    Release to patient:   Immediate     Diagnoses and all orders for this visit:  Annual physical exam  Essential hypertension - stable  Hyperlipidemia, unspecified hyperlipidemia type - stable -     Comprehensive Metabolic Panel (CMP); Future -     Lipid Panel w/calc LDL; Future  Impaired glucose tolerance - stable -     Hemoglobin A1C; Future  Gastroesophageal reflux disease without esophagitis - stable  Medicare annual wellness visit, subsequent  Counseled regarding healthy lifestyle including diet/exercise Continue current medications - advised patient to have pharmacy contact our office for refills Reviewed past labs with patient, await above ordered labs Immunizations: Flu shot yearly in the fall Screening: Colonoscopy due 2028 Avoid triggers for reflux  Return in about 6 months (around 02/14/2025) for  Regular follow up, Schedule labs, Fasting.       Immunizations: Pneumonia: Up-to-date Shingles: Up-to-date Tetanus: Up-to-date Flu shot yearly in the fall RSV vaccine up-to-date   Well Visit, Over 65: Care Instructions Well visits can help you stay healthy. Your doctor has checked your overall health and  may have suggested ways to take good care of yourself. Your doctor also may have recommended tests. You can help prevent illness with healthy eating, good sleep, vaccinations, regular exercise, and other steps.  Get the tests that you and your doctor decide on. Depending on your age and risks, examples might include hearing tests as well as screening for colon, breast, and lung cancer. Screening helps find diseases before any symptoms appear.  Eat healthy foods. Choose fruits, vegetables, whole grains, lean protein, and low-fat dairy foods. Limit saturated fat, and reduce salt.   Limit alcohol. Men should have no more than 2 drinks a day. Women should have no more than 1. For some people, no alcohol is the best choice.  Exercise. It can help prevent falls. Get at least 30 minutes of exercise on most days of the week. Walking, yoga, and tai chi can be good choices.   Reach and stay at your healthy weight. This will lower your risk for many health problems.  Take care of your mental health. Try to stay connected with friends, family, and community, and find ways to manage stress.   If you're feeling depressed or hopeless, talk to someone. A counselor can help. If you don't have a counselor, talk to your doctor.  Talk to your doctor if you think you may have a problem with alcohol or drug use. This includes prescription medicines and illegal drugs.   Avoid tobacco and nicotine: Don't smoke, vape, or chew. If you need help quitting, talk to your doctor.  Practice safer sex. Getting tested, using condoms or dental dams, and limiting sex partners can help prevent STIs.   Make an advance  directive. This is a legal way to tell your family and doctor what you want to happen at the end of your life or when you can't speak for yourself.  Prevent problems where you can. Protect your skin from too much sun, wash your hands, brush your teeth twice a day, and wear a seat belt in the car.  Where can you learn more? Log in to your [facility] account at [email or web address] and click on top menu option Health then select Search Medical Library. Enter [phone] in the search box and click the magnify glass to learn more about Well Visit, Over 65: Care Instructions. Current as of: March 14, 2024 Content Version: 14.6  2024-2025 [place], [place].  Care instructions adapted under license by your healthcare professional. If you have questions about a medical condition or this instruction, always ask your healthcare professional. Romayne Alderman, [facility], [place] disclaims any warranty or liability for your use of this information.

## 2024-10-17 ENCOUNTER — Encounter: Payer: Self-pay | Admitting: *Deleted
